# Patient Record
Sex: Female | Born: 1975 | Race: White | Hispanic: No | Marital: Married | Smoking: Former smoker
Health system: Southern US, Community
[De-identification: ages and names within clinical notes are randomized; demographics above are authoritative.]

## PROBLEM LIST (undated history)

## (undated) DIAGNOSIS — E079 Disorder of thyroid, unspecified: Secondary | ICD-10-CM

## (undated) DIAGNOSIS — I341 Nonrheumatic mitral (valve) prolapse: Secondary | ICD-10-CM

## (undated) DIAGNOSIS — Z8659 Personal history of other mental and behavioral disorders: Secondary | ICD-10-CM

## (undated) DIAGNOSIS — E8881 Metabolic syndrome: Secondary | ICD-10-CM

## (undated) DIAGNOSIS — N6019 Diffuse cystic mastopathy of unspecified breast: Secondary | ICD-10-CM

## (undated) DIAGNOSIS — G4700 Insomnia, unspecified: Secondary | ICD-10-CM

## (undated) DIAGNOSIS — N946 Dysmenorrhea, unspecified: Secondary | ICD-10-CM

## (undated) DIAGNOSIS — N920 Excessive and frequent menstruation with regular cycle: Secondary | ICD-10-CM

## (undated) DIAGNOSIS — K9041 Non-celiac gluten sensitivity: Secondary | ICD-10-CM

## (undated) DIAGNOSIS — L680 Hirsutism: Secondary | ICD-10-CM

## (undated) DIAGNOSIS — K529 Noninfective gastroenteritis and colitis, unspecified: Secondary | ICD-10-CM

## (undated) DIAGNOSIS — K859 Acute pancreatitis without necrosis or infection, unspecified: Secondary | ICD-10-CM

## (undated) DIAGNOSIS — E669 Obesity, unspecified: Secondary | ICD-10-CM

## (undated) DIAGNOSIS — K3184 Gastroparesis: Secondary | ICD-10-CM

## (undated) DIAGNOSIS — J189 Pneumonia, unspecified organism: Secondary | ICD-10-CM

## (undated) DIAGNOSIS — D734 Cyst of spleen: Secondary | ICD-10-CM

## (undated) DIAGNOSIS — Z8709 Personal history of other diseases of the respiratory system: Secondary | ICD-10-CM

## (undated) DIAGNOSIS — G43909 Migraine, unspecified, not intractable, without status migrainosus: Secondary | ICD-10-CM

## (undated) DIAGNOSIS — K52832 Lymphocytic colitis: Secondary | ICD-10-CM

## (undated) DIAGNOSIS — L659 Nonscarring hair loss, unspecified: Secondary | ICD-10-CM

## (undated) DIAGNOSIS — F419 Anxiety disorder, unspecified: Secondary | ICD-10-CM

## (undated) DIAGNOSIS — E282 Polycystic ovarian syndrome: Secondary | ICD-10-CM

## (undated) DIAGNOSIS — G629 Polyneuropathy, unspecified: Secondary | ICD-10-CM

## (undated) DIAGNOSIS — E559 Vitamin D deficiency, unspecified: Secondary | ICD-10-CM

## (undated) DIAGNOSIS — D649 Anemia, unspecified: Secondary | ICD-10-CM

## (undated) HISTORY — DX: Disorder of thyroid, unspecified: E07.9

## (undated) HISTORY — DX: Cyst of spleen: D73.4

## (undated) HISTORY — PX: OTHER SURGICAL HISTORY: SHX169

## (undated) HISTORY — PX: EXPLORATORY LAPAROTOMY: SUR591

## (undated) HISTORY — DX: Anemia, unspecified: D64.9

## (undated) HISTORY — DX: Excessive and frequent menstruation with regular cycle: N92.0

## (undated) HISTORY — DX: Insomnia, unspecified: G47.00

## (undated) HISTORY — DX: Obesity, unspecified: E66.9

## (undated) HISTORY — DX: Migraine, unspecified, not intractable, without status migrainosus: G43.909

## (undated) HISTORY — DX: Personal history of other mental and behavioral disorders: Z86.59

## (undated) HISTORY — PX: ESOPHAGOGASTRODUODENOSCOPY: SHX1529

## (undated) HISTORY — PX: CHOLECYSTECTOMY: SHX55

## (undated) HISTORY — DX: Metabolic syndrome: E88.81

## (undated) HISTORY — DX: Metabolic syndrome: E88.810

## (undated) HISTORY — DX: Polycystic ovarian syndrome: E28.2

## (undated) HISTORY — DX: Anxiety disorder, unspecified: F41.9

## (undated) HISTORY — DX: Nonscarring hair loss, unspecified: L65.9

## (undated) HISTORY — DX: Hirsutism: L68.0

## (undated) HISTORY — DX: Non-celiac gluten sensitivity: K90.41

## (undated) HISTORY — DX: Dysmenorrhea, unspecified: N94.6

## (undated) HISTORY — DX: Lymphocytic colitis: K52.832

## (undated) HISTORY — DX: Vitamin D deficiency, unspecified: E55.9

---

## 1998-10-29 ENCOUNTER — Emergency Department (HOSPITAL_COMMUNITY): Admission: EM | Admit: 1998-10-29 | Discharge: 1998-10-29 | Payer: Self-pay | Admitting: Emergency Medicine

## 1998-10-30 ENCOUNTER — Encounter: Payer: Self-pay | Admitting: Emergency Medicine

## 1998-10-30 ENCOUNTER — Ambulatory Visit (HOSPITAL_COMMUNITY): Admission: RE | Admit: 1998-10-30 | Discharge: 1998-10-30 | Payer: Self-pay | Admitting: Emergency Medicine

## 1998-10-31 ENCOUNTER — Emergency Department (HOSPITAL_COMMUNITY): Admission: EM | Admit: 1998-10-31 | Discharge: 1998-10-31 | Payer: Self-pay | Admitting: Emergency Medicine

## 1999-01-17 ENCOUNTER — Other Ambulatory Visit: Admission: RE | Admit: 1999-01-17 | Discharge: 1999-01-17 | Payer: Self-pay | Admitting: Obstetrics and Gynecology

## 1999-06-01 ENCOUNTER — Ambulatory Visit (HOSPITAL_COMMUNITY): Admission: RE | Admit: 1999-06-01 | Discharge: 1999-06-01 | Payer: Self-pay | Admitting: Obstetrics and Gynecology

## 1999-06-01 ENCOUNTER — Encounter: Payer: Self-pay | Admitting: Obstetrics and Gynecology

## 1999-06-22 ENCOUNTER — Ambulatory Visit (HOSPITAL_COMMUNITY): Admission: RE | Admit: 1999-06-22 | Discharge: 1999-06-22 | Payer: Self-pay | Admitting: Obstetrics and Gynecology

## 1999-10-17 ENCOUNTER — Emergency Department (HOSPITAL_COMMUNITY): Admission: EM | Admit: 1999-10-17 | Discharge: 1999-10-17 | Payer: Self-pay | Admitting: Emergency Medicine

## 1999-12-11 HISTORY — PX: WRIST SURGERY: SHX841

## 2000-01-03 ENCOUNTER — Other Ambulatory Visit: Admission: RE | Admit: 2000-01-03 | Discharge: 2000-01-03 | Payer: Self-pay | Admitting: Obstetrics and Gynecology

## 2000-12-04 ENCOUNTER — Emergency Department (HOSPITAL_COMMUNITY): Admission: EM | Admit: 2000-12-04 | Discharge: 2000-12-04 | Payer: Self-pay | Admitting: Emergency Medicine

## 2001-08-21 ENCOUNTER — Encounter: Payer: Self-pay | Admitting: Orthopaedic Surgery

## 2001-08-21 ENCOUNTER — Ambulatory Visit (HOSPITAL_COMMUNITY): Admission: RE | Admit: 2001-08-21 | Discharge: 2001-08-21 | Payer: Self-pay | Admitting: Orthopaedic Surgery

## 2001-09-23 ENCOUNTER — Ambulatory Visit (HOSPITAL_BASED_OUTPATIENT_CLINIC_OR_DEPARTMENT_OTHER): Admission: RE | Admit: 2001-09-23 | Discharge: 2001-09-23 | Payer: Self-pay | Admitting: Orthopaedic Surgery

## 2002-01-14 ENCOUNTER — Other Ambulatory Visit: Admission: RE | Admit: 2002-01-14 | Discharge: 2002-01-14 | Payer: Self-pay | Admitting: Obstetrics and Gynecology

## 2002-08-11 ENCOUNTER — Inpatient Hospital Stay (HOSPITAL_COMMUNITY): Admission: AD | Admit: 2002-08-11 | Discharge: 2002-08-11 | Payer: Self-pay | Admitting: Obstetrics and Gynecology

## 2002-08-25 ENCOUNTER — Inpatient Hospital Stay (HOSPITAL_COMMUNITY): Admission: AD | Admit: 2002-08-25 | Discharge: 2002-08-25 | Payer: Self-pay | Admitting: Obstetrics and Gynecology

## 2002-10-16 ENCOUNTER — Inpatient Hospital Stay (HOSPITAL_COMMUNITY): Admission: AD | Admit: 2002-10-16 | Discharge: 2002-10-16 | Payer: Self-pay | Admitting: Obstetrics and Gynecology

## 2002-10-19 ENCOUNTER — Observation Stay (HOSPITAL_COMMUNITY): Admission: AD | Admit: 2002-10-19 | Discharge: 2002-10-20 | Payer: Self-pay | Admitting: Obstetrics and Gynecology

## 2002-11-05 ENCOUNTER — Inpatient Hospital Stay (HOSPITAL_COMMUNITY): Admission: AD | Admit: 2002-11-05 | Discharge: 2002-11-09 | Payer: Self-pay | Admitting: Obstetrics and Gynecology

## 2002-11-06 ENCOUNTER — Encounter: Payer: Self-pay | Admitting: Obstetrics and Gynecology

## 2002-12-16 ENCOUNTER — Other Ambulatory Visit: Admission: RE | Admit: 2002-12-16 | Discharge: 2002-12-16 | Payer: Self-pay | Admitting: Obstetrics and Gynecology

## 2004-02-14 ENCOUNTER — Other Ambulatory Visit: Admission: RE | Admit: 2004-02-14 | Discharge: 2004-02-14 | Payer: Self-pay | Admitting: Obstetrics and Gynecology

## 2005-10-03 ENCOUNTER — Ambulatory Visit: Payer: Self-pay | Admitting: Family Medicine

## 2005-11-07 ENCOUNTER — Ambulatory Visit: Payer: Self-pay | Admitting: Family Medicine

## 2005-12-05 ENCOUNTER — Ambulatory Visit: Payer: Self-pay | Admitting: Family Medicine

## 2006-05-28 ENCOUNTER — Ambulatory Visit: Payer: Self-pay | Admitting: Family Medicine

## 2006-09-17 DIAGNOSIS — E282 Polycystic ovarian syndrome: Secondary | ICD-10-CM

## 2006-09-17 DIAGNOSIS — G43909 Migraine, unspecified, not intractable, without status migrainosus: Secondary | ICD-10-CM | POA: Insufficient documentation

## 2006-09-17 DIAGNOSIS — F339 Major depressive disorder, recurrent, unspecified: Secondary | ICD-10-CM | POA: Insufficient documentation

## 2007-01-21 ENCOUNTER — Encounter: Payer: Self-pay | Admitting: Family Medicine

## 2011-10-03 ENCOUNTER — Other Ambulatory Visit: Payer: Self-pay | Admitting: Gastroenterology

## 2011-10-03 DIAGNOSIS — R1032 Left lower quadrant pain: Secondary | ICD-10-CM

## 2011-10-05 ENCOUNTER — Other Ambulatory Visit: Payer: Self-pay

## 2011-10-18 ENCOUNTER — Other Ambulatory Visit (HOSPITAL_COMMUNITY): Payer: Self-pay | Admitting: Gastroenterology

## 2011-10-18 DIAGNOSIS — R112 Nausea with vomiting, unspecified: Secondary | ICD-10-CM

## 2011-10-24 ENCOUNTER — Ambulatory Visit (HOSPITAL_COMMUNITY)
Admission: RE | Admit: 2011-10-24 | Discharge: 2011-10-24 | Disposition: A | Discharge status: Home / Self Care | Payer: 59 | Source: Ambulatory Visit | Attending: Gastroenterology | Admitting: Gastroenterology

## 2011-10-24 DIAGNOSIS — R1032 Left lower quadrant pain: Secondary | ICD-10-CM | POA: Insufficient documentation

## 2011-10-26 ENCOUNTER — Other Ambulatory Visit: Payer: Self-pay | Admitting: Family Medicine

## 2011-10-30 ENCOUNTER — Other Ambulatory Visit: Payer: Self-pay | Admitting: Gastroenterology

## 2011-10-30 DIAGNOSIS — R1032 Left lower quadrant pain: Secondary | ICD-10-CM

## 2011-10-30 DIAGNOSIS — R112 Nausea with vomiting, unspecified: Secondary | ICD-10-CM

## 2011-10-31 ENCOUNTER — Other Ambulatory Visit (HOSPITAL_COMMUNITY): Payer: Self-pay

## 2011-11-05 ENCOUNTER — Inpatient Hospital Stay: Admission: RE | Admit: 2011-11-05 | Payer: 59 | Source: Ambulatory Visit

## 2011-11-05 ENCOUNTER — Ambulatory Visit
Admission: RE | Admit: 2011-11-05 | Discharge: 2011-11-05 | Disposition: A | Discharge status: Home / Self Care | Payer: 59 | Source: Ambulatory Visit | Attending: Gastroenterology | Admitting: Gastroenterology

## 2011-11-05 DIAGNOSIS — R112 Nausea with vomiting, unspecified: Secondary | ICD-10-CM

## 2011-11-05 DIAGNOSIS — R1032 Left lower quadrant pain: Secondary | ICD-10-CM

## 2011-11-05 MED ORDER — IOHEXOL 300 MG/ML  SOLN
125.0000 mL | Freq: Once | INTRAMUSCULAR | Status: AC | PRN
  Administered 2011-11-05: 125 mL via INTRAVENOUS

## 2012-10-12 IMAGING — CT CT ABD-PELV W/ CM
2 of 4 series · 15 of 42 positions shown, 19 images · IV contrast (READICAT/WATER & OMNI 300/[ID])
Comparison: None

CLINICAL DATA: Left lower quadrant pain, nausea, vomiting,
constipation and diarrhea, diagnosed with colitis

CT ABDOMEN AND PELVIS WITH CONTRAST
TECHNIQUE: Multidetector CT imaging of the abdomen and pelvis was
performed following the standard protocol during bolus
administration of intravenous contrast. Sagittal and coronal MPR
images reconstructed from axial data set.
Contrast: 125mL OMNIPAQUE IOHEXOL 300 MG/ML IV SOLN; Dilute oral
contrast.

[Series 2: abd/pelvis with · axial · 0.84mm/px · z∈[-374,+20]mm · 12 of 91 slices shown, 16 images]
[im 8/91  soft-tissue]
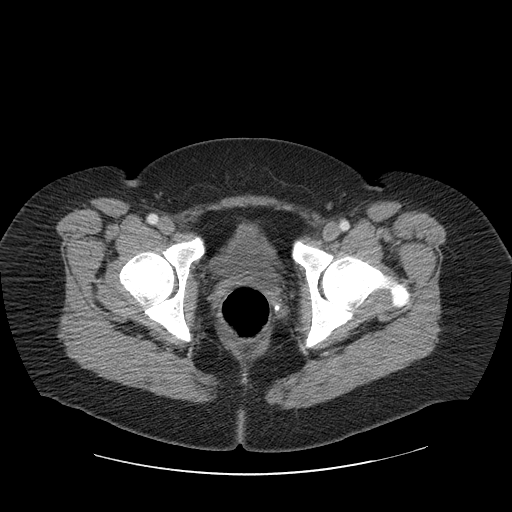
[im 8/91  bone]
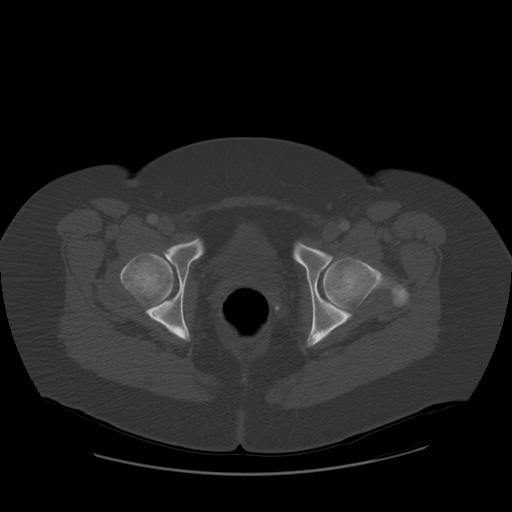
[im 16/91  soft-tissue]
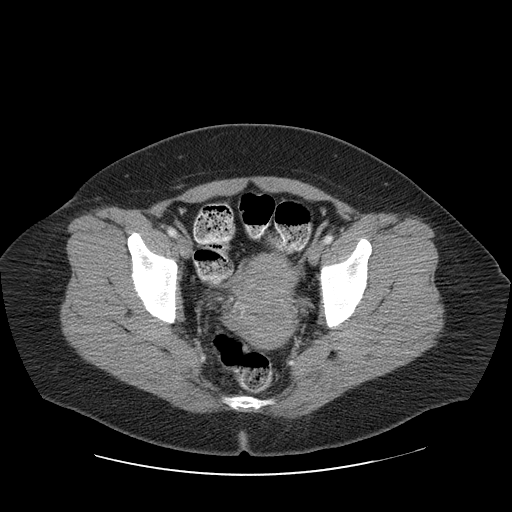
[im 23/91  soft-tissue]
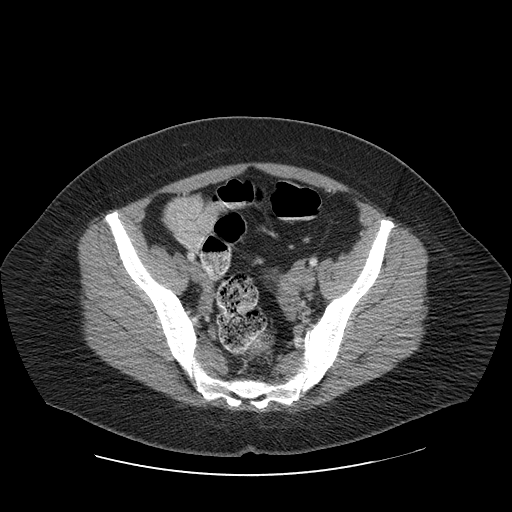
[im 34/91  soft-tissue]
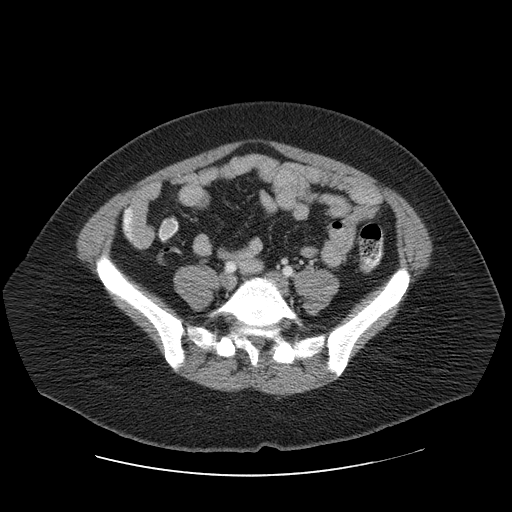
[im 42/91  soft-tissue]
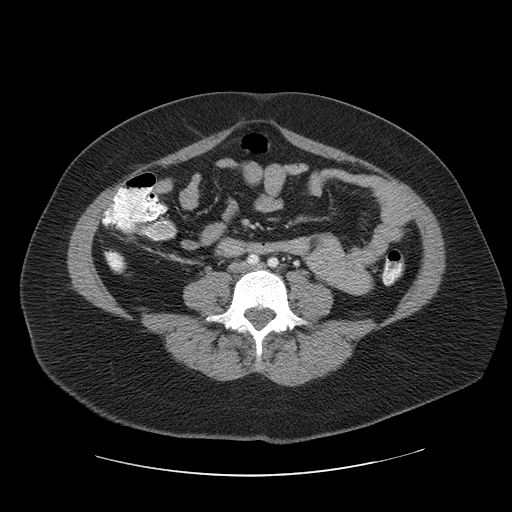
[im 49/91  soft-tissue]
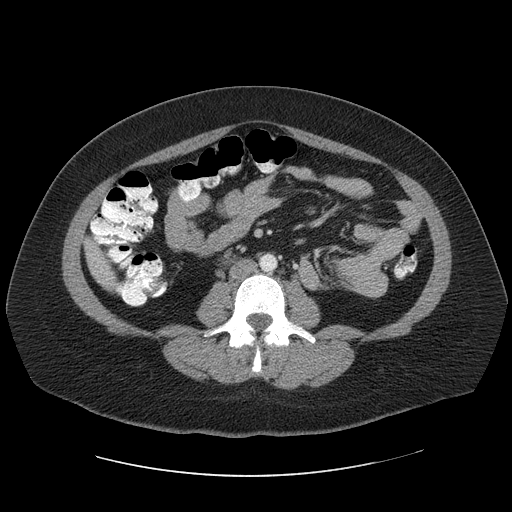
[im 57/91  soft-tissue]
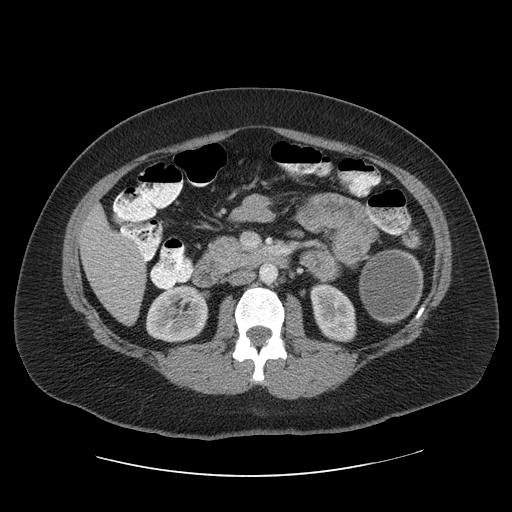
[im 68/91  soft-tissue]
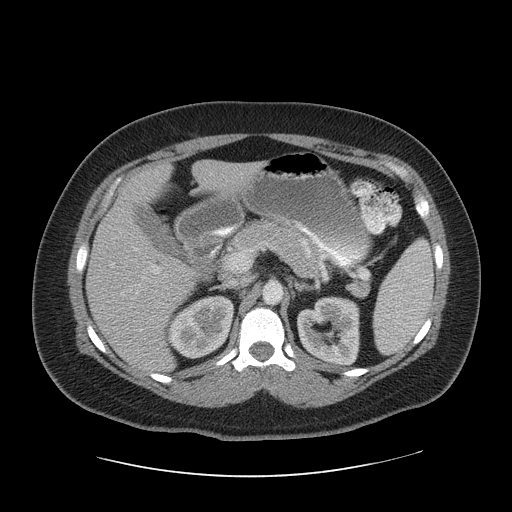
[im 76/91  soft-tissue]
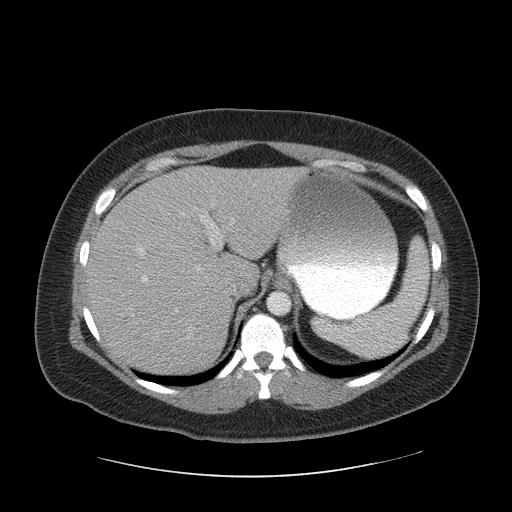
[im 76/91  lung]
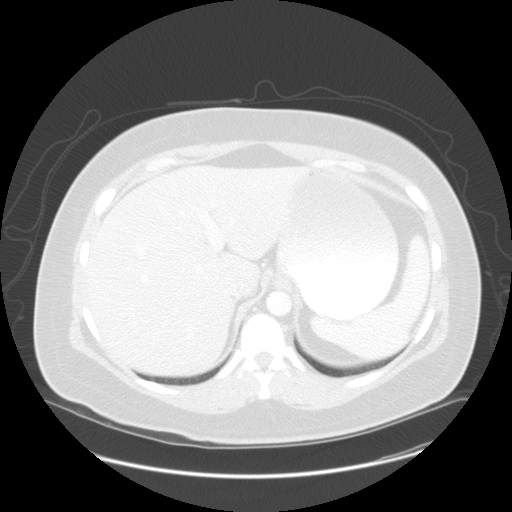
[im 76/91  bone]
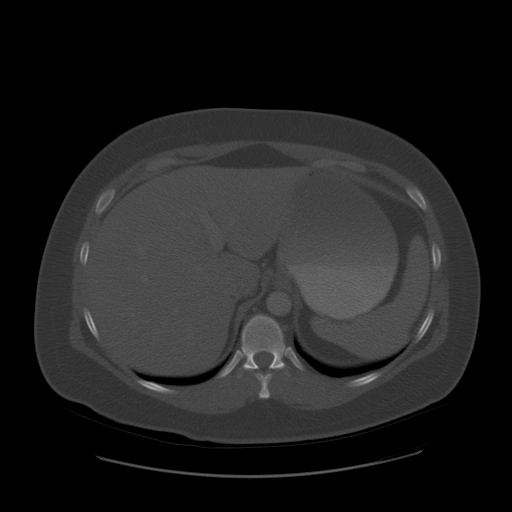
[im 79/91  lung]
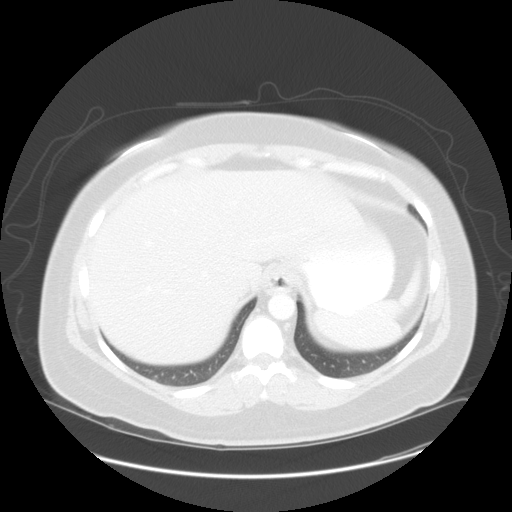
[im 83/91  soft-tissue]
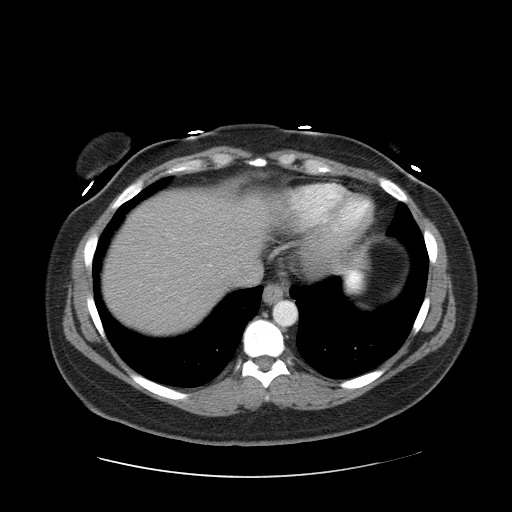
[im 83/91  lung]
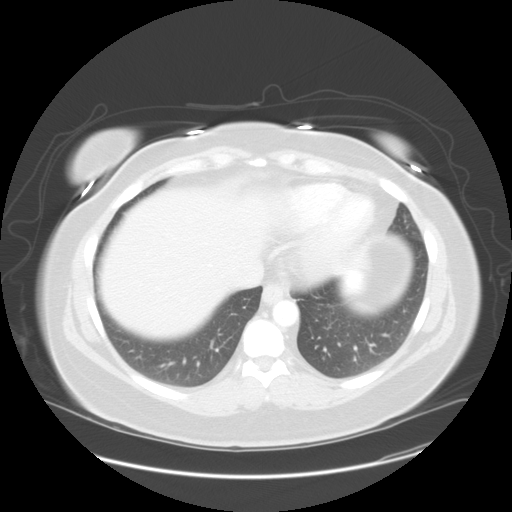
[im 87/91  lung]
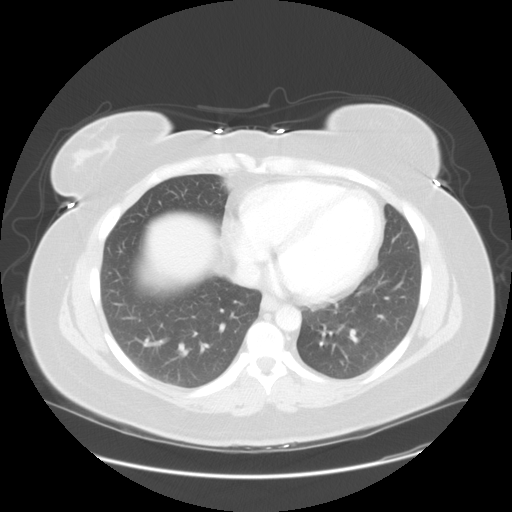

[Series 400: sag · sagittal · 0.97mm/px · 3 of 173 slices shown]
[im 35/173  soft-tissue]
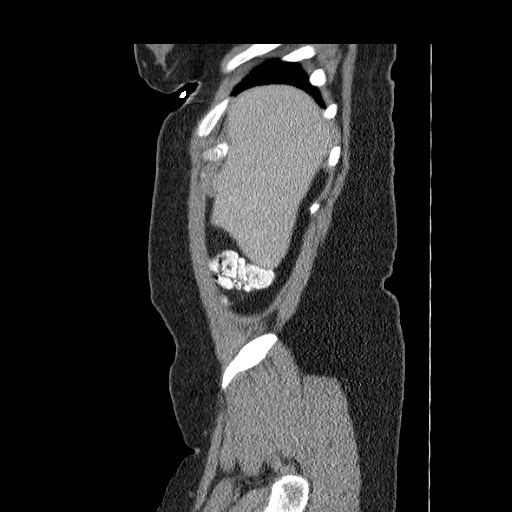
[im 69/173  soft-tissue]
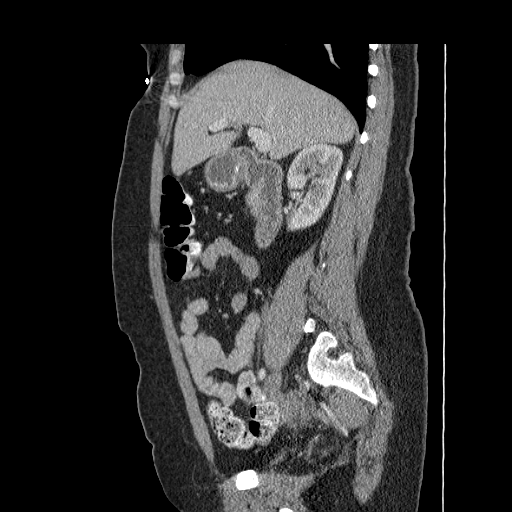
[im 104/173  soft-tissue]
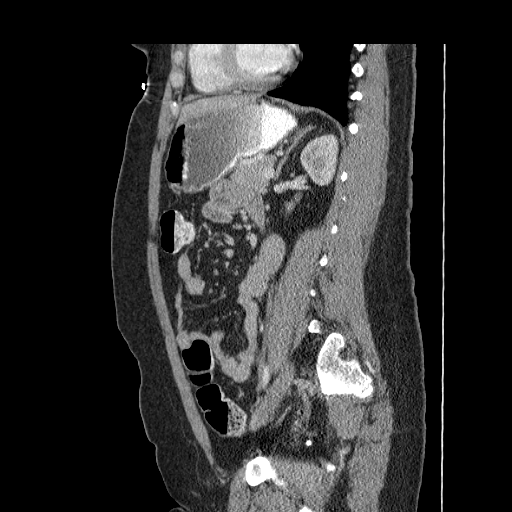

[15 of 42 positions shown; findings below may reference images not displayed]

FINDINGS: Lung bases clear.
Large complicated cyst with minimal wall calcification identified
within spleen, 6.0 x 5.3 x 6.5 cm.
No definite mural nodularity or enhancement are identified.
Liver, spleen, pancreas, kidneys, and adrenal glands otherwise
normal appearance.
Normal appendix.
Unremarkable uterus, adnexae and decompressed bladder.
Scattered pelvic phleboliths.
Stomach and bowel loops normal appearance.
No mass, adenopathy, free fluid or inflammatory process.
Specifically, no evidence of colonic wall thickening, colitis, or
diverticulosis identified.
No hernias or acute bony lesions.
IMPRESSION: Large mildly complicated cyst with minimal wall calcification
identified within spleen, 6.0 x 5.3 x 6.5 cm in size.
This is of uncertain etiology and duration.
This could be result of previous trauma/hematoma or infection.
Recommend follow-up limited ultrasound of the abdomen/spleen in 6
months to confirm stability.
Otherwise negative exam.

## 2013-07-16 ENCOUNTER — Other Ambulatory Visit: Payer: Self-pay | Admitting: Family Medicine

## 2013-07-16 DIAGNOSIS — D7389 Other diseases of spleen: Secondary | ICD-10-CM

## 2013-07-17 ENCOUNTER — Other Ambulatory Visit: Payer: 59

## 2013-08-24 ENCOUNTER — Ambulatory Visit (INDEPENDENT_AMBULATORY_CARE_PROVIDER_SITE_OTHER): Payer: 59

## 2013-08-24 DIAGNOSIS — D7389 Other diseases of spleen: Secondary | ICD-10-CM

## 2013-09-07 ENCOUNTER — Ambulatory Visit (INDEPENDENT_AMBULATORY_CARE_PROVIDER_SITE_OTHER): Payer: 59 | Admitting: General Surgery

## 2013-09-07 ENCOUNTER — Encounter (INDEPENDENT_AMBULATORY_CARE_PROVIDER_SITE_OTHER): Payer: Self-pay

## 2013-09-07 ENCOUNTER — Encounter (INDEPENDENT_AMBULATORY_CARE_PROVIDER_SITE_OTHER): Payer: Self-pay | Admitting: General Surgery

## 2013-09-07 VITALS — BP 140/90 | HR 68 | Temp 97.8°F | Resp 14 | Ht 68.0 in | Wt 243.0 lb

## 2013-09-07 DIAGNOSIS — D734 Cyst of spleen: Secondary | ICD-10-CM | POA: Insufficient documentation

## 2013-09-07 DIAGNOSIS — D7389 Other diseases of spleen: Secondary | ICD-10-CM

## 2013-09-07 HISTORY — DX: Cyst of spleen: D73.4

## 2013-09-07 NOTE — Patient Instructions (Addendum)
We will plan laparoscopic cyst unroofing.    Main risks of surgery are bleeding, infection, damage to adjacent structures, risk of open operation, risk of splenectomy, and other general surgical risks.    Please go ahead and get vaccines.  You will have lifting and activity restrictions for at least 2 weeks.    No shower for 48 hours after surgery.

## 2013-09-07 NOTE — Progress Notes (Signed)
Chief Complaint  Patient presents with  . New Evaluation    eval splenic cyst    HISTORY: The patient is a 37 year old female who presents at the request of Dr. Joycelyn Rua for consultation regarding a splenic cyst.  She has had left-sided abdominal pain for several years. Also at that time 2 years ago, she was experiencing significant bloating and bowel irregularity. She worked with the gastroenterologist and ended up switching to a gluten-free diet. She does not have celiac sprue but is felt to have a significant including hypersensitivity.  The bloating and bowel irregularity has been significantly improved and much of her pain has been resolved with switching to gluten-free diet.  However, the left-sided abdominal pain remains present. Sometimes it is more severe than others, but she is always aware of it. She underwent repeat imaging in the form of ultrasound this summer. This was compared to CT scan in 2012.  The cyst is slightly larger. The cyst is located at the lower pole and has a small amount of calcifications at the edge.  Past Medical History  Diagnosis Date  . Polycystic ovary syndrome   . Thyroid disease     chronic thyroid hormone supplementation  . H/O eating disorder   . Gluten intolerance   . Vitamin D deficiency   . Hirsutism   . Alopecia   . Migraines   . Lymphocytic colitis   . Anxiety   . Obesity   . Insomnia   . Cyst of spleen   . Dysmenorrhea   . Menorrhagia   . Insulin resistance syndrome   . Anemia     Past Surgical History  Procedure Laterality Date  . Cesarean section  2003  . Wrist surgery  1999  . Exploratory laparotomy      1999    Current Outpatient Prescriptions  Medication Sig Dispense Refill  . FLUoxetine (PROZAC) 20 MG capsule Take 20 mg by mouth every morning.      . metFORMIN (GLUCOPHAGE) 500 MG tablet Take 500 mg by mouth 2 (two) times daily with a meal.      . zolpidem (AMBIEN) 10 MG tablet Take 10 mg by mouth at bedtime as  needed for sleep.       No current facility-administered medications for this visit.     Allergies  Allergen Reactions  . Topamax [Topiramate] Anaphylaxis    neuropathy  . Ceftin [Cefuroxime Axetil] Swelling    In tongue  . Sumatriptan     REACTION: LOC,head burning     Family History  Problem Relation Age of Onset  . Cancer Maternal Grandmother     breast     History   Social History  . Marital Status: Married    Spouse Name: N/A    Number of Children: N/A  . Years of Education: N/A   Social History Main Topics  . Smoking status: Former Smoker    Quit date: 12/10/1996  . Smokeless tobacco: Never Used  . Alcohol Use: No  . Drug Use: No  . Sexual Activity: None   Social History Narrative  . 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       REVIEW OF SYSTEMS - PERTINENT POSITIVES ONLY: 12 point review of systems negative other than HPI and PMH except for abdominal distention and pain.  Headaches.    EXAM: Filed Vitals:   09/07/13 1029  BP: 140/90  Pulse: 68  Temp: 97.8 F (36.6 C)  Resp: 14   Filed Weights  09/07/13 1029  Weight: 243 lb (110.224 kg)     Gen:  No acute distress.  Well nourished and well groomed.   Neurological: Alert and oriented to person, place, and time. Coordination normal.  Head: Normocephalic and atraumatic.  Eyes: Conjunctivae are normal. Pupils are equal, round, and reactive to light. No scleral icterus.  Neck: Normal range of motion. Neck supple. No tracheal deviation or thyromegaly present.  Cardiovascular: Normal rate, regular rhythm, normal heart sounds and intact distal pulses.  Exam reveals no gallop and no friction rub.  No murmur heard. Respiratory: Effort normal.  No respiratory distress. No chest wall tenderness. Breath sounds normal.  No wheezes, rales or rhonchi.  GI: Soft. Bowel sounds are normal. The abdomen is soft and nondistended.  There is tenderness in the left abdomen just below the costal margin.  There is no  rebound and no guarding.  Musculoskeletal: Normal range of motion. Extremities are nontender.  Lymphadenopathy: No cervical, preauricular, postauricular or axillary adenopathy is present Skin: Skin is warm and dry. No rash noted. No diaphoresis. No erythema. No pallor. No clubbing, cyanosis, or edema.   Psychiatric: Normal mood and affect. Behavior is normal. Judgment and thought content normal.    LABORATORY RESULTS: Available labs are reviewed  n/a  RADIOLOGY RESULTS: See E-Chart or I-Site for most recent results.  Images and reports are reviewed. CT 2012 IMPRESSION:  Large mildly complicated cyst with minimal wall calcification  identified within spleen, 6.0 x 5.3 x 6.5 cm in size.  This is of uncertain etiology and duration.  This could be result of previous trauma/hematoma or infection.  Recommend follow-up limited ultrasound of the abdomen/spleen in 6  months to confirm stability.  Otherwise negative exam.  U/s 2014 FINDINGS:  Spleen: 8.2 cm in length. A simple cyst is 7.0 x 5.5 x 7.2 cm.  Scattered wall calcifications are again noted. A small amount of  layering debris is identified within the cyst.    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  I think her left sided abdominal pain is attributable to this.  Because the cyst is located at the lower pole, we will plan laparoscopic splenic cyst fenestration. I advised the patient that if there is significant bleeding, we may have to do a splenectomy. Because of this, I have asked her to receive the splenectomy vaccines before surgery. We discussed postoperative activity restrictions. I discussed the risk of surgery including bleeding, infection, damage to adjacent structures, risk of transfusion, risk of open operation, and risk of splenectomy.  She will work with her daughter's school schedule.       Maudry Diego MD Surgical Oncology, General and Endocrine  Surgery [facility]      Visit Diagnoses: 1. Splenic cyst     Primary Care Physician: Joycelyn Rua, MD

## 2013-09-07 NOTE — Assessment & Plan Note (Signed)
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  I think her left sided abdominal pain is attributable to this.  Because the cyst is located at the lower pole, we will plan laparoscopic splenic cyst fenestration. I advised the patient that if there is significant bleeding, we may have to do a splenectomy. Because of this, I have asked her to receive the splenectomy vaccines before surgery. We discussed postoperative activity restrictions. I discussed the risk of surgery including bleeding, infection, damage to adjacent structures, risk of transfusion, risk of open operation, and risk of splenectomy.  She will work with her daughter's school schedule.

## 2013-09-25 ENCOUNTER — Telehealth (INDEPENDENT_AMBULATORY_CARE_PROVIDER_SITE_OTHER): Payer: Self-pay

## 2013-09-25 NOTE — Telephone Encounter (Signed)
error 

## 2013-10-09 ENCOUNTER — Encounter (HOSPITAL_COMMUNITY): Payer: Self-pay | Admitting: Pharmacy Technician

## 2013-10-14 ENCOUNTER — Encounter (HOSPITAL_COMMUNITY)
Admission: RE | Admit: 2013-10-14 | Discharge: 2013-10-14 | Disposition: A | Discharge status: Home / Self Care | Payer: 59 | Source: Ambulatory Visit | Attending: General Surgery | Admitting: General Surgery

## 2013-10-14 ENCOUNTER — Encounter (HOSPITAL_COMMUNITY): Payer: Self-pay

## 2013-10-14 DIAGNOSIS — Z01810 Encounter for preprocedural cardiovascular examination: Secondary | ICD-10-CM | POA: Insufficient documentation

## 2013-10-14 DIAGNOSIS — Z01818 Encounter for other preprocedural examination: Secondary | ICD-10-CM | POA: Insufficient documentation

## 2013-10-14 DIAGNOSIS — Z01812 Encounter for preprocedural laboratory examination: Secondary | ICD-10-CM | POA: Insufficient documentation

## 2013-10-14 HISTORY — DX: Personal history of other diseases of the respiratory system: Z87.09

## 2013-10-14 HISTORY — DX: Pneumonia, unspecified organism: J18.9

## 2013-10-14 HISTORY — DX: Nonrheumatic mitral (valve) prolapse: I34.1

## 2013-10-14 HISTORY — DX: Noninfective gastroenteritis and colitis, unspecified: K52.9

## 2013-10-14 HISTORY — DX: Polyneuropathy, unspecified: G62.9

## 2013-10-14 LAB — PROTIME-INR
INR: 0.9 (ref 0.00–1.49)
Prothrombin Time: 12 seconds (ref 11.6–15.2)

## 2013-10-14 LAB — HCG, SERUM, QUALITATIVE: Preg, Serum: NEGATIVE

## 2013-10-14 LAB — ABO/RH: ABO/RH(D): A NEG

## 2013-10-14 LAB — URINALYSIS, ROUTINE W REFLEX MICROSCOPIC
Bilirubin Urine: NEGATIVE
Ketones, ur: NEGATIVE mg/dL
Leukocytes, UA: NEGATIVE
Nitrite: NEGATIVE
Protein, ur: NEGATIVE mg/dL
Urobilinogen, UA: 0.2 mg/dL (ref 0.0–1.0)
pH: 5.5 (ref 5.0–8.0)

## 2013-10-14 LAB — CBC WITH DIFFERENTIAL/PLATELET
Lymphocytes Relative: 20 % (ref 12–46)
Lymphs Abs: 1.9 10*3/uL (ref 0.7–4.0)
Neutro Abs: 6.7 10*3/uL (ref 1.7–7.7)
Neutrophils Relative %: 69 % (ref 43–77)
Platelets: 337 10*3/uL (ref 150–400)
RBC: 4.89 MIL/uL (ref 3.87–5.11)
WBC: 9.6 10*3/uL (ref 4.0–10.5)

## 2013-10-14 LAB — TYPE AND SCREEN
ABO/RH(D): A NEG
Antibody Screen: NEGATIVE

## 2013-10-14 LAB — APTT: aPTT: 27 seconds (ref 24–37)

## 2013-10-14 LAB — URINE MICROSCOPIC-ADD ON

## 2013-10-14 MED ORDER — CHLORHEXIDINE GLUCONATE 4 % EX LIQD: 1.0000 "application " | Freq: Once | CUTANEOUS | Status: DC

## 2013-10-14 NOTE — Progress Notes (Signed)
Pt doesn't have a cardiologist  Denies ever having an echo/stress test/heart cath  Joycelyn Rua is Medical Md  Denies EKG or CXR in past yr

## 2013-10-14 NOTE — Pre-Procedure Instructions (Signed)
Beth Thompson  10/14/2013   Your procedure is scheduled on:  Thurs, Nov 13 @ 7:35 AM  Report to [facility] Stay [facility]trance A at 5:30 AM.  Call this number if you have problems the morning of surgery: [phone]   Remember:   Do not eat food or drink liquids after midnight.   Take these medicines the morning of surgery with A SIP OF WATER: Fluoxetine(Prozac)               No Goody's,BC's,Aleve,Aspirin,Ibuprofen,Fish Oil,or any Herbal Medications   Do not wear jewelry, make-up or nail polish.  Do not wear lotions, powders, or perfumes. You may wear deodorant.  Do not shave 48 hours prior to surgery.  Do not bring valuables to the hospital.  [facility] is not responsible                  for any belongings or valuables.               Contacts, dentures or bridgework may not be worn into surgery.  Leave suitcase in the car. After surgery it may be brought to your room.  For patients admitted to the hospital, discharge time is determined by your                treatment team.                   Special Instructions: Shower using CHG 2 nights before surgery and the night before surgery.  If you shower the day of surgery use CHG.  Use special wash - you have one bottle of CHG for all showers.  You should use approximately 1/3 of the bottle for each shower.   Please read over the following fact sheets that you were given: Pain Booklet, Coughing and Deep Breathing, Blood Transfusion Information and Surgical Site Infection Prevention

## 2013-10-14 NOTE — Progress Notes (Signed)
cmet done this morning at medical md [phone] Called to request but results not back-will need to follow up

## 2013-10-14 NOTE — Progress Notes (Signed)
Sleep study done about a yr ago and neg for sleep apnea

## 2013-10-16 NOTE — Progress Notes (Addendum)
Anesthesia Chart Review:  Patient is a 37 year old female scheduled for laparoscopic splenic cyst unroofing, possible splenectomy on 10/22/13 by Dr. Donell Beers.  History includes obesity, former smoker, PCOS, insulin resistance syndrome, gluten intolerance, alopecia, lymphocytic colitis, MVP, migraines, anxiety, anemia, neuropathy with Topamax, thyroid disorder ([facility] notes under media tab state "chronic thyroid supplementation with no documented indication"), eating disorder.  PCP is Dr. Joycelyn Rua.   EKG on 10/14/13 showed NSR.  Preoperative labs noted. Her CMET results from 10/14/13 from her PCP office were received and showed a glucose 90, BUN 12, Cr 0.77, Na 138, K 4.2, AST 17, ALT 25.  Anticipate she can proceed as planned.  [facility] Stay [facility]/Anesthesiology Phone [phone] 10/16/2013 11:03 AM

## 2013-10-21 MED ORDER — CIPROFLOXACIN IN D5W 400 MG/200ML IV SOLN
400.0000 mg | INTRAVENOUS | Status: AC
  Administered 2013-10-22: 400 mg via INTRAVENOUS
  Filled 2013-10-21: qty 200

## 2013-10-22 ENCOUNTER — Encounter (HOSPITAL_COMMUNITY)
Admission: RE | Disposition: A | Discharge status: Home / Self Care | Payer: Self-pay | Source: Ambulatory Visit | Attending: General Surgery

## 2013-10-22 ENCOUNTER — Inpatient Hospital Stay (HOSPITAL_COMMUNITY)
Admission: RE | Admit: 2013-10-22 | Discharge: 2013-10-25 | DRG: 801 | Disposition: A | Discharge status: Home / Self Care | Payer: 59 | Source: Ambulatory Visit | Attending: General Surgery | Admitting: General Surgery

## 2013-10-22 ENCOUNTER — Inpatient Hospital Stay (HOSPITAL_COMMUNITY): Payer: 59 | Admitting: Anesthesiology

## 2013-10-22 ENCOUNTER — Encounter (HOSPITAL_COMMUNITY): Payer: Self-pay | Admitting: Surgery

## 2013-10-22 ENCOUNTER — Encounter (HOSPITAL_COMMUNITY): Payer: 59 | Admitting: Vascular Surgery

## 2013-10-22 DIAGNOSIS — D7389 Other diseases of spleen: Secondary | ICD-10-CM

## 2013-10-22 DIAGNOSIS — E669 Obesity, unspecified: Secondary | ICD-10-CM | POA: Diagnosis present

## 2013-10-22 DIAGNOSIS — F411 Generalized anxiety disorder: Secondary | ICD-10-CM | POA: Diagnosis present

## 2013-10-22 DIAGNOSIS — D734 Cyst of spleen: Secondary | ICD-10-CM

## 2013-10-22 DIAGNOSIS — Z79899 Other long term (current) drug therapy: Secondary | ICD-10-CM

## 2013-10-22 DIAGNOSIS — Z87891 Personal history of nicotine dependence: Secondary | ICD-10-CM

## 2013-10-22 DIAGNOSIS — E8881 Metabolic syndrome: Secondary | ICD-10-CM | POA: Diagnosis present

## 2013-10-22 DIAGNOSIS — E282 Polycystic ovarian syndrome: Secondary | ICD-10-CM | POA: Diagnosis present

## 2013-10-22 DIAGNOSIS — Z6837 Body mass index (BMI) 37.0-37.9, adult: Secondary | ICD-10-CM

## 2013-10-22 DIAGNOSIS — G4700 Insomnia, unspecified: Secondary | ICD-10-CM | POA: Diagnosis present

## 2013-10-22 HISTORY — PX: CYST EXCISION: SHX5701

## 2013-10-22 HISTORY — PX: LAPAROSCOPIC SPLENECTOMY: SHX409

## 2013-10-22 HISTORY — DX: Cyst of spleen: D73.4

## 2013-10-22 SURGERY — SPLENECTOMY, LAPAROSCOPIC
Anesthesia: General | Site: Abdomen | Wound class: Clean

## 2013-10-22 MED ORDER — ROCURONIUM BROMIDE 100 MG/10ML IV SOLN
INTRAVENOUS | Status: DC | PRN
  Administered 2013-10-22: 20 mg via INTRAVENOUS
  Administered 2013-10-22: 50 mg via INTRAVENOUS
  Administered 2013-10-22: 10 mg via INTRAVENOUS

## 2013-10-22 MED ORDER — ACETAMINOPHEN 500 MG PO TABS
1000.0000 mg | ORAL_TABLET | Freq: Four times a day (QID) | ORAL | Status: AC
  Administered 2013-10-22: 500 mg via ORAL
  Administered 2013-10-22 – 2013-10-23 (×2): 1000 mg via ORAL
  Filled 2013-10-22 (×3): qty 2

## 2013-10-22 MED ORDER — TIZANIDINE HCL 2 MG PO TABS
2.0000 mg | ORAL_TABLET | ORAL | Status: DC | PRN
  Filled 2013-10-22: qty 1

## 2013-10-22 MED ORDER — MIDAZOLAM HCL 2 MG/2ML IJ SOLN: 1.0000 mg | INTRAMUSCULAR | Status: DC | PRN

## 2013-10-22 MED ORDER — METFORMIN HCL 500 MG PO TABS
500.0000 mg | ORAL_TABLET | Freq: Two times a day (BID) | ORAL | Status: DC
  Administered 2013-10-22 – 2013-10-25 (×6): 500 mg via ORAL
  Filled 2013-10-22 (×10): qty 1

## 2013-10-22 MED ORDER — HEMOSTATIC AGENTS (NO CHARGE) OPTIME
TOPICAL | Status: DC | PRN
  Administered 2013-10-22 (×2): 1 via TOPICAL

## 2013-10-22 MED ORDER — CIPROFLOXACIN IN D5W 400 MG/200ML IV SOLN
400.0000 mg | Freq: Two times a day (BID) | INTRAVENOUS | Status: AC
  Administered 2013-10-22: 400 mg via INTRAVENOUS
  Filled 2013-10-22: qty 200

## 2013-10-22 MED ORDER — EPHEDRINE SULFATE 50 MG/ML IJ SOLN
INTRAMUSCULAR | Status: DC | PRN
  Administered 2013-10-22: 5 mg via INTRAVENOUS
  Administered 2013-10-22: 10 mg via INTRAVENOUS

## 2013-10-22 MED ORDER — LIDOCAINE HCL (CARDIAC) 20 MG/ML IV SOLN
INTRAVENOUS | Status: DC | PRN
  Administered 2013-10-22: 100 mg via INTRAVENOUS

## 2013-10-22 MED ORDER — SODIUM CHLORIDE 0.9 % IR SOLN
Status: DC | PRN
  Administered 2013-10-22 (×2): 1000 mL

## 2013-10-22 MED ORDER — 0.9 % SODIUM CHLORIDE (POUR BTL) OPTIME
TOPICAL | Status: DC | PRN
  Administered 2013-10-22: 1000 mL

## 2013-10-22 MED ORDER — MORPHINE SULFATE 2 MG/ML IJ SOLN
1.0000 mg | INTRAMUSCULAR | Status: DC | PRN
  Administered 2013-10-22 – 2013-10-23 (×5): 2 mg via INTRAVENOUS
  Filled 2013-10-22 (×4): qty 1

## 2013-10-22 MED ORDER — OXYCODONE-ACETAMINOPHEN 5-325 MG PO TABS
1.0000 | ORAL_TABLET | ORAL | Status: DC | PRN
  Administered 2013-10-22 – 2013-10-24 (×3): 1 via ORAL
  Administered 2013-10-24 – 2013-10-25 (×3): 2 via ORAL
  Filled 2013-10-22: qty 2
  Filled 2013-10-22: qty 1
  Filled 2013-10-22 (×2): qty 2
  Filled 2013-10-22 (×2): qty 1

## 2013-10-22 MED ORDER — HYDROMORPHONE HCL PF 1 MG/ML IJ SOLN
0.2500 mg | INTRAMUSCULAR | Status: DC | PRN
  Administered 2013-10-22 (×2): 0.5 mg via INTRAVENOUS

## 2013-10-22 MED ORDER — ONDANSETRON HCL 4 MG PO TABS
4.0000 mg | ORAL_TABLET | Freq: Four times a day (QID) | ORAL | Status: DC | PRN
  Filled 2013-10-22: qty 1

## 2013-10-22 MED ORDER — BUPIVACAINE 0.25 % ON-Q PUMP DUAL CATH 300 ML
300.0000 mL | INJECTION | Status: DC
  Filled 2013-10-22: qty 300

## 2013-10-22 MED ORDER — KCL IN DEXTROSE-NACL 20-5-0.45 MEQ/L-%-% IV SOLN
INTRAVENOUS | Status: AC
  Filled 2013-10-22: qty 1000

## 2013-10-22 MED ORDER — PROPOFOL 10 MG/ML IV BOLUS
INTRAVENOUS | Status: DC | PRN
  Administered 2013-10-22: 200 mg via INTRAVENOUS

## 2013-10-22 MED ORDER — OXYCODONE HCL 5 MG PO TABS: 5.0000 mg | ORAL_TABLET | Freq: Once | ORAL | Status: DC | PRN

## 2013-10-22 MED ORDER — ONDANSETRON HCL 4 MG/2ML IJ SOLN
4.0000 mg | Freq: Four times a day (QID) | INTRAMUSCULAR | Status: DC | PRN
  Administered 2013-10-22: 4 mg via INTRAVENOUS
  Filled 2013-10-22 (×2): qty 2

## 2013-10-22 MED ORDER — ONDANSETRON HCL 4 MG/2ML IJ SOLN
INTRAMUSCULAR | Status: DC | PRN
  Administered 2013-10-22 (×2): 4 mg via INTRAVENOUS

## 2013-10-22 MED ORDER — ONDANSETRON HCL 4 MG/2ML IJ SOLN
INTRAMUSCULAR | Status: AC
  Filled 2013-10-22: qty 2

## 2013-10-22 MED ORDER — FENTANYL CITRATE 0.05 MG/ML IJ SOLN: 50.0000 ug | Freq: Once | INTRAMUSCULAR | Status: DC

## 2013-10-22 MED ORDER — LIDOCAINE HCL (PF) 1 % IJ SOLN
INTRAMUSCULAR | Status: AC
  Filled 2013-10-22: qty 30

## 2013-10-22 MED ORDER — FENTANYL CITRATE 0.05 MG/ML IJ SOLN
INTRAMUSCULAR | Status: DC | PRN
  Administered 2013-10-22: 100 ug via INTRAVENOUS
  Administered 2013-10-22 (×2): 50 ug via INTRAVENOUS
  Administered 2013-10-22: 150 ug via INTRAVENOUS
  Administered 2013-10-22: 50 ug via INTRAVENOUS

## 2013-10-22 MED ORDER — MIDAZOLAM HCL 5 MG/5ML IJ SOLN
INTRAMUSCULAR | Status: DC | PRN
  Administered 2013-10-22: 2 mg via INTRAVENOUS

## 2013-10-22 MED ORDER — KCL IN DEXTROSE-NACL 20-5-0.45 MEQ/L-%-% IV SOLN
INTRAVENOUS | Status: DC
  Administered 2013-10-22: 1000 mL via INTRAVENOUS
  Administered 2013-10-22 – 2013-10-25 (×5): via INTRAVENOUS
  Filled 2013-10-22 (×9): qty 1000

## 2013-10-22 MED ORDER — FLUOXETINE HCL 20 MG PO CAPS
40.0000 mg | ORAL_CAPSULE | Freq: Every day | ORAL | Status: DC
  Administered 2013-10-23 – 2013-10-25 (×3): 40 mg via ORAL
  Filled 2013-10-22 (×5): qty 2

## 2013-10-22 MED ORDER — GLYCOPYRROLATE 0.2 MG/ML IJ SOLN
INTRAMUSCULAR | Status: DC | PRN
  Administered 2013-10-22: .8 mg via INTRAVENOUS

## 2013-10-22 MED ORDER — BUPIVACAINE-EPINEPHRINE PF 0.25-1:200000 % IJ SOLN
INTRAMUSCULAR | Status: AC
  Filled 2013-10-22: qty 30

## 2013-10-22 MED ORDER — HYDROMORPHONE HCL PF 1 MG/ML IJ SOLN
INTRAMUSCULAR | Status: AC
  Filled 2013-10-22: qty 1

## 2013-10-22 MED ORDER — LACTATED RINGERS IV SOLN
INTRAVENOUS | Status: DC | PRN
  Administered 2013-10-22 (×2): via INTRAVENOUS

## 2013-10-22 MED ORDER — ZOLPIDEM TARTRATE 5 MG PO TABS
5.0000 mg | ORAL_TABLET | Freq: Every evening | ORAL | Status: DC | PRN
  Administered 2013-10-22 – 2013-10-24 (×3): 5 mg via ORAL
  Filled 2013-10-22 (×3): qty 1

## 2013-10-22 MED ORDER — LIDOCAINE HCL 1 % IJ SOLN
INTRAMUSCULAR | Status: DC | PRN
  Administered 2013-10-22: 09:00:00 via INTRAMUSCULAR

## 2013-10-22 MED ORDER — PROMETHAZINE HCL 25 MG/ML IJ SOLN: 6.2500 mg | INTRAMUSCULAR | Status: DC | PRN

## 2013-10-22 MED ORDER — DIPHENHYDRAMINE HCL 25 MG PO CAPS
25.0000 mg | ORAL_CAPSULE | Freq: Four times a day (QID) | ORAL | Status: DC | PRN
  Administered 2013-10-23 – 2013-10-24 (×3): 25 mg via ORAL
  Filled 2013-10-22 (×3): qty 1

## 2013-10-22 MED ORDER — NEOSTIGMINE METHYLSULFATE 1 MG/ML IJ SOLN
INTRAMUSCULAR | Status: DC | PRN
  Administered 2013-10-22: 4 mg via INTRAVENOUS

## 2013-10-22 MED ORDER — MORPHINE SULFATE 2 MG/ML IJ SOLN
INTRAMUSCULAR | Status: AC
  Filled 2013-10-22: qty 1

## 2013-10-22 MED ORDER — OXYCODONE HCL 5 MG/5ML PO SOLN: 5.0000 mg | Freq: Once | ORAL | Status: DC | PRN

## 2013-10-22 SURGICAL SUPPLY — 92 items
BAG BILE T-TUBES STRL (MISCELLANEOUS) IMPLANT
BLADE SURG ROTATE 9660 (MISCELLANEOUS) IMPLANT
CANISTER SUCTION 2500CC (MISCELLANEOUS) ×2 IMPLANT
CATH ROBINSON RED A/P 14FR (CATHETERS) IMPLANT
CATH ROBINSON RED A/P 16FR (CATHETERS) IMPLANT
CHLORAPREP W/TINT 26ML (MISCELLANEOUS) ×2 IMPLANT
CLIP LIGATING HEM O LOK PURPLE (MISCELLANEOUS) IMPLANT
CLIP LIGATING HEMO O LOK GREEN (MISCELLANEOUS) IMPLANT
CLIP LIGATING HEMOLOK MED (MISCELLANEOUS) IMPLANT
CLIP TI LARGE 6 (CLIP) IMPLANT
CLIP TI MEDIUM 24 (CLIP) IMPLANT
CLIP TI WIDE RED SMALL 24 (CLIP) IMPLANT
COVER SURGICAL LIGHT HANDLE (MISCELLANEOUS) ×2 IMPLANT
DERMABOND ADVANCED (GAUZE/BANDAGES/DRESSINGS) ×1
DERMABOND ADVANCED .7 DNX12 (GAUZE/BANDAGES/DRESSINGS) ×1 IMPLANT
DRAIN CHANNEL 19F RND (DRAIN) IMPLANT
DRAIN PENROSE 1/2X36 STERILE (WOUND CARE) IMPLANT
DRAPE UTILITY 15X26 W/TAPE STR (DRAPE) ×4 IMPLANT
DRAPE WARM FLUID 44X44 (DRAPE) ×2 IMPLANT
DRSG COVADERM 4X10 (GAUZE/BANDAGES/DRESSINGS) IMPLANT
DRSG COVADERM 4X14 (GAUZE/BANDAGES/DRESSINGS) IMPLANT
DRSG PAD ABDOMINAL 8X10 ST (GAUZE/BANDAGES/DRESSINGS) IMPLANT
ELECT BLADE 6.5 EXT (BLADE) IMPLANT
ELECT CAUTERY BLADE 6.4 (BLADE) ×2 IMPLANT
ELECT REM PT RETURN 9FT ADLT (ELECTROSURGICAL) ×2
ELECTRODE REM PT RTRN 9FT ADLT (ELECTROSURGICAL) ×1 IMPLANT
EVACUATOR SILICONE 100CC (DRAIN) IMPLANT
GLOVE BIO SURGEON STRL SZ 6 (GLOVE) ×2 IMPLANT
GLOVE BIO SURGEON STRL SZ7.5 (GLOVE) ×2 IMPLANT
GLOVE BIOGEL PI IND STRL 6.5 (GLOVE) ×1 IMPLANT
GLOVE BIOGEL PI IND STRL 7.0 (GLOVE) ×2 IMPLANT
GLOVE BIOGEL PI IND STRL 7.5 (GLOVE) ×1 IMPLANT
GLOVE BIOGEL PI INDICATOR 6.5 (GLOVE) ×1
GLOVE BIOGEL PI INDICATOR 7.0 (GLOVE) ×2
GLOVE BIOGEL PI INDICATOR 7.5 (GLOVE) ×1
GLOVE SURG SS PI 6.5 STRL IVOR (GLOVE) ×2 IMPLANT
GLOVE SURG SS PI 7.0 STRL IVOR (GLOVE) ×2 IMPLANT
GOWN PREVENTION PLUS XXLARGE (GOWN DISPOSABLE) ×4 IMPLANT
GOWN STRL NON-REIN LRG LVL3 (GOWN DISPOSABLE) ×2 IMPLANT
HEMOSTAT SNOW SURGICEL 2X4 (HEMOSTASIS) ×4 IMPLANT
KIT BASIN OR (CUSTOM PROCEDURE TRAY) ×2 IMPLANT
KIT ROOM TURNOVER OR (KITS) ×2 IMPLANT
NS IRRIG 1000ML POUR BTL (IV SOLUTION) ×2 IMPLANT
PACK GENERAL/GYN (CUSTOM PROCEDURE TRAY) ×2 IMPLANT
PAD ARMBOARD 7.5X6 YLW CONV (MISCELLANEOUS) ×4 IMPLANT
PENCIL BUTTON HOLSTER BLD 10FT (ELECTRODE) ×2 IMPLANT
PLUG CATH AND CAP STER (CATHETERS) IMPLANT
POUCH SPECIMEN RETRIEVAL 10MM (ENDOMECHANICALS) ×4 IMPLANT
RELOAD WHITE ECR60W (STAPLE) IMPLANT
SCALPEL HARMONIC ACE (MISCELLANEOUS) ×2 IMPLANT
SCISSORS HARMONIC WAVE 18CM (INSTRUMENTS) IMPLANT
SEALANT SURGICAL APPL DUAL CAN (MISCELLANEOUS) ×2 IMPLANT
SET IRRIG TUBING LAPAROSCOPIC (IRRIGATION / IRRIGATOR) ×2 IMPLANT
SLEEVE ENDOPATH XCEL 5M (ENDOMECHANICALS) ×4 IMPLANT
SLEEVE SURGEON STRL (DRAPES) IMPLANT
SPONGE GAUZE 4X4 12PLY (GAUZE/BANDAGES/DRESSINGS) IMPLANT
STAPLE ECHEON FLEX 60 POW ENDO (STAPLE) IMPLANT
STAPLER VISISTAT 35W (STAPLE) IMPLANT
SUT CHROMIC 3 0 SH 27 (SUTURE) IMPLANT
SUT CHROMIC 4 0 RB 1X27 (SUTURE) IMPLANT
SUT ETHILON 2 0 FS 18 (SUTURE) IMPLANT
SUT MNCRL AB 4-0 PS2 18 (SUTURE) ×4 IMPLANT
SUT PDS AB 1 TP1 96 (SUTURE) IMPLANT
SUT PDS AB 3-0 SH 27 (SUTURE) IMPLANT
SUT PDS AB 4-0 RB1 27 (SUTURE) ×12 IMPLANT
SUT PDS II 0 TP-1 LOOPED 60 (SUTURE) IMPLANT
SUT PROLENE 3 0 SH 48 (SUTURE) IMPLANT
SUT PROLENE 4 0 RB 1 (SUTURE)
SUT PROLENE 4 0 SH DA (SUTURE) IMPLANT
SUT PROLENE 4-0 RB1 .5 CRCL 36 (SUTURE) IMPLANT
SUT PROLENE 5 0 C1 (SUTURE) IMPLANT
SUT SILK 2 0 SH CR/8 (SUTURE) IMPLANT
SUT SILK 2 0 TIES 10X30 (SUTURE) IMPLANT
SUT SILK 3 0 SH CR/8 (SUTURE) IMPLANT
SUT SILK 3 0 TIES 10X30 (SUTURE) IMPLANT
SUT SILK 3 0SH CR/8 30 (SUTURE) IMPLANT
SUT VIC AB 3-0 SH 18 (SUTURE) IMPLANT
SUT VIC AB 5-0 TF 27 (SUTURE) IMPLANT
SUT VICRYL AB 2 0 TIES (SUTURE) IMPLANT
SYS LAPSCP GELPORT 120MM (MISCELLANEOUS)
SYSTEM LAPSCP GELPORT 120MM (MISCELLANEOUS) IMPLANT
TOWEL OR 17X24 6PK STRL BLUE (TOWEL DISPOSABLE) ×2 IMPLANT
TOWEL OR 17X26 10 PK STRL BLUE (TOWEL DISPOSABLE) ×2 IMPLANT
TOWEL OR NON WOVEN STRL DISP B (DISPOSABLE) ×4 IMPLANT
TRAY FOLEY CATH 14FRSI W/METER (CATHETERS) ×2 IMPLANT
TRAY LAPAROSCOPIC (CUSTOM PROCEDURE TRAY) ×2 IMPLANT
TROCAR XCEL 12X100 BLDLESS (ENDOMECHANICALS) ×2 IMPLANT
TROCAR XCEL BLUNT TIP 100MML (ENDOMECHANICALS) ×2 IMPLANT
TROCAR XCEL NON-BLD 5MMX100MML (ENDOMECHANICALS) ×2 IMPLANT
TUBE CONNECTING 12X1/4 (SUCTIONS) ×2 IMPLANT
TUBING FILTER THERMOFLATOR (ELECTROSURGICAL) ×2 IMPLANT
YANKAUER SUCT BULB TIP NO VENT (SUCTIONS) ×2 IMPLANT

## 2013-10-22 NOTE — Op Note (Signed)
PRE-OPERATIVE DIAGNOSIS: splenic cyst  POST-OPERATIVE DIAGNOSIS:  Same  PROCEDURE:  Procedure(s): Laparoscopic splenic cystectomy  SURGEON:  Surgeon(s): Almond Lint, MD  ASSIST:  Romie Levee, MD   ANESTHESIA:   local and general  DRAINS: none   LOCAL MEDICATIONS USED:  MARCAINE    and XYLOCAINE   SPECIMEN:  Source of Specimen:  splenic cyst  DISPOSITION OF SPECIMEN:  PATHOLOGY  COUNTS:  YES  DICTATION: .Dragon Dictation  PLAN OF CARE: Admit to inpatient   PATIENT DISPOSITION:  PACU - hemodynamically stable.  EBL:  50 mL  FINDINGS:  Calcified splenic cyst.  PROCEDURE:  Patient was identified in the holding area and taken to the operating room where she was placed supine on the operating room table. General endotracheal anesthesia was induced. A Foley catheter was placed. The patient was then placed into the leaning spleen position on a beanbag. Care was taken to place an axillary roll and adequately cushion her and secured her to the bed.  Her abdomen was then prepped and draped in sterile fashion. Timeout was performed according to the surgical safety checklist. When all was correct, we continued.  A Hassan trocar was placed in the infraumbilical position after administration of local anesthetic. A curvilinear incision was made at the site of her previous incision. A Tresa Endo was used to dissect the subcutaneous tissues. The umbilical stalk was elevated with a Coker clamp and an incision was made in the fascia vertically. A pursestring suture was placed around the fascial incision. The Roseanne Reno was introduced into the abdomen and pelvis placed to the abdominal wall with Telfa the suture. Pneumoperitoneum was achieved to a pressure of 15 mm of mercury.  25 mm trochars were placed under direct visualization in the midline. A third one was placed in the left upper quadrant. The omentum was pulled away from the spleen. The lienoolic ligament was taken down with the harmonic scalpel.  The sling this was immediately visible at the inferior border of the spleen. The harmonic scalpel was used to enter the cyst and the cystic fluid was aspirated. The cyst was grasped and a harmonic was used to take off the top portion of this.  At this point, the cyst wall began to separate easily from the spleen and so the harmonic was used to take the cyst wall from the spleen. The cyst wall was taken in multiple pieces.    The cyst wall was retrieved from the abdomen with an Endo Catch bag.  The edges of the spleen was cauterized with the spatula cautery.  SNOW hemostatic agent was placed in the splenic bed.  Omentum was placed over the top.    A 4 quadrant inspection was performed. There was no evidence of bleeding or other gross pathology.    The 5 mm trocars were removed and there was no bleeding from the abdominal wall.  Pneumoperitoneum was removed.  The pursestring suture was used to close the umbilical incision.  No residual fascial defect was palpable.  The skin was closed with a 4-0 monocryl.  The wounds were cleaned, dried, and dressed with dermabond.    The patient was awakened from anesthesia and taken to the PACU in stable condition.

## 2013-10-22 NOTE — H&P (Signed)
Chief Complaint   Patient presents with   .  New Evaluation     eval splenic cyst    HISTORY:  The patient is a 37 year old female who presents at the request of Dr. Joycelyn Rua for consultation regarding a splenic cyst. She has had left-sided abdominal pain for several years. Also at that time 2 years ago, she was experiencing significant bloating and bowel irregularity. She worked with the gastroenterologist and ended up switching to a gluten-free diet. She does not have celiac sprue but is felt to have a significant including hypersensitivity. The bloating and bowel irregularity has been significantly improved and much of her pain has been resolved with switching to gluten-free diet.  However, the left-sided abdominal pain remains present. Sometimes it is more severe than others, but she is always aware of it. She underwent repeat imaging in the form of ultrasound this summer. This was compared to CT scan in 2012. The cyst is slightly larger. The cyst is located at the lower pole and has a small amount of calcifications at the edge.   She has not had any changes since her last evaluation.   Past Medical History   Diagnosis  Date   .  Polycystic ovary syndrome    .  Thyroid disease      chronic thyroid hormone supplementation   .  H/O eating disorder    .  Gluten intolerance    .  Vitamin D deficiency    .  Hirsutism    .  Alopecia    .  Migraines    .  Lymphocytic colitis    .  Anxiety    .  Obesity    .  Insomnia    .  Cyst of spleen    .  Dysmenorrhea    .  Menorrhagia    .  Insulin resistance syndrome    .  Anemia     Past Surgical History   Procedure  Laterality  Date   .  Cesarean section   2003   .  Wrist surgery   1999   .  Exploratory laparotomy       1999    Current Outpatient Prescriptions   Medication  Sig  Dispense  Refill   .  FLUoxetine (PROZAC) 20 MG capsule  Take 20 mg by mouth every morning.     .  metFORMIN (GLUCOPHAGE) 500 MG tablet  Take 500 mg by  mouth 2 (two) times daily with a meal.     .  zolpidem (AMBIEN) 10 MG tablet  Take 10 mg by mouth at bedtime as needed for sleep.      No current facility-administered medications for this visit.    Allergies   Allergen  Reactions   .  Topamax [Topiramate]  Anaphylaxis     neuropathy   .  Ceftin [Cefuroxime Axetil]  Swelling     In tongue   .  Sumatriptan      REACTION: LOC,head burning    Family History   Problem  Relation  Age of Onset   .  Cancer  Maternal Grandmother      breast    History    Social History   .  Marital Status:  Married     Spouse Name:  N/A     Number of Children:  N/A   .  Years of Education:  N/A    Social History Main Topics   .  Smoking status:  Former Smoker     Quit date:  12/10/1996   .  Smokeless tobacco:  Never Used   .  Alcohol Use:  No   .  Drug Use:  No   .  Sexual Activity:  None    Social History Narrative   .  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    REVIEW OF SYSTEMS - PERTINENT POSITIVES ONLY:  12 point review of systems negative other than HPI and PMH except for abdominal distention and pain. Headaches.   EXAM:  Filed Vitals:    09/07/13 1029   BP:  140/90   Pulse:  68   Temp:  97.8 F (36.6 C)   Resp:  14    Filed Weights    09/07/13 1029   Weight:  243 lb (110.224 kg)    Gen: No acute distress. Well nourished and well groomed.  Neurological: Alert and oriented to person, place, and time. Coordination normal.  Head: Normocephalic and atraumatic.  Eyes: Conjunctivae are normal. Pupils are equal, round, and reactive to light. No scleral icterus.   Cardiovascular: Normal rate, regular rhythm Respiratory: Effort normal. No respiratory distress. No chest wall tenderness.   GI: Soft.  The abdomen is soft and nondistended. There is tenderness in the left abdomen just below the costal margin. There is no rebound and no guarding.  Musculoskeletal: Normal range of motion. Extremities are nontender.  Lymphadenopathy: No  cervical, preauricular, postauricular or axillary adenopathy is present Skin: Skin is warm and dry. No rash noted. No diaphoresis. No erythema. No pallor. No clubbing, cyanosis, or edema.  Psychiatric: Normal mood and affect. Behavior is normal. Judgment and thought content normal.   LABORATORY RESULTS:  Available labs are reviewed  n/a  RADIOLOGY RESULTS:  See E-Chart or I-Site for most recent results. Images and reports are reviewed.  CT 2012  IMPRESSION:  Large mildly complicated cyst with minimal wall calcification  identified within spleen, 6.0 x 5.3 x 6.5 cm in size.  This is of uncertain etiology and duration.  This could be result of previous trauma/hematoma or infection.  Recommend follow-up limited ultrasound of the abdomen/spleen in 6  months to confirm stability.  Otherwise negative exam.  U/s 2014  FINDINGS:  Spleen: 8.2 cm in length. A simple cyst is 7.0 x 5.5 x 7.2 cm.  Scattered wall calcifications are again noted. A small amount of  layering debris is identified within the cyst.   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  I think her left sided abdominal pain is attributable to this.  Because the cyst is located at the lower pole, we will plan laparoscopic splenic cyst fenestration.  I advised the patient that if there is significant bleeding, we may have to do a splenectomy. Because of this, I have asked her to receive the splenectomy vaccines before surgery.   We discussed postoperative activity restrictions. I discussed the risk of surgery including bleeding, infection, damage to adjacent structures, risk of transfusion, risk of open operation, and risk of splenectomy.    Maudry Diego MD  Surgical Oncology, General and Endocrine Surgery  [facility]   Visit Diagnoses:  1.  Splenic cyst   Primary Care Physician:  Joycelyn Rua, MD

## 2013-10-22 NOTE — Anesthesia Postprocedure Evaluation (Signed)
  Anesthesia Post-op Note  Patient: Beth Thompson  Procedure(s) Performed: Procedure(s): LAPAROSCOPIC SPLENIC CYST UNROOFING  (N/A)  Patient Location: PACU  Anesthesia Type:General  Level of Consciousness: awake  Airway and Oxygen Therapy: Patient Spontanous Breathing  Post-op Pain: mild  Post-op Assessment: Post-op Vital signs reviewed, Patient's Cardiovascular Status Stable, Respiratory Function Stable, Patent Airway, No signs of Nausea or vomiting and Pain level controlled  Post-op Vital Signs: Reviewed and stable  Complications: No apparent anesthesia complications

## 2013-10-22 NOTE — Transfer of Care (Signed)
Immediate Anesthesia Transfer of Care Note  Patient: Beth Thompson  Procedure(s) Performed: Procedure(s): LAPAROSCOPIC SPLENIC CYST UNROOFING  (N/A)  Patient Location: PACU  Anesthesia Type:General  Level of Consciousness: awake, alert  and oriented  Airway & Oxygen Therapy: Patient Spontanous Breathing and Patient connected to face mask oxygen  Post-op Assessment: Report given to PACU RN and Post -op Vital signs reviewed and stable  Post vital signs: Reviewed and stable  Complications: No apparent anesthesia complications

## 2013-10-22 NOTE — Anesthesia Preprocedure Evaluation (Signed)
Anesthesia Evaluation  Patient identified by MRN, date of birth, ID band Patient awake    Reviewed: Allergy & Precautions, H&P , NPO status , Patient's Chart, lab work & pertinent test results  Airway Mallampati: II TM Distance: >3 FB Neck ROM: Full    Dental   Pulmonary COPDformer smoker,  breath sounds clear to auscultation        Cardiovascular hypertension, + Valvular Problems/Murmurs MVP Rhythm:Regular Rate:Normal     Neuro/Psych  Headaches, Depression    GI/Hepatic   Endo/Other  diabetes  Renal/GU      Musculoskeletal   Abdominal (+) + obese,   Peds  Hematology   Anesthesia Other Findings Undx htn  Reproductive/Obstetrics                           Anesthesia Physical Anesthesia Plan  ASA: III  Anesthesia Plan: General   Post-op Pain Management:    Induction: Intravenous  Airway Management Planned: Oral ETT  Additional Equipment:   Intra-op Plan:   Post-operative Plan: Extubation in OR  Informed Consent: I have reviewed the patients History and Physical, chart, labs and discussed the procedure including the risks, benefits and alternatives for the proposed anesthesia with the patient or authorized representative who has indicated his/her understanding and acceptance.     Plan Discussed with: CRNA and Surgeon  Anesthesia Plan Comments:         Anesthesia Quick Evaluation

## 2013-10-22 NOTE — Anesthesia Procedure Notes (Signed)
Procedure Name: Intubation Date/Time: 10/22/2013 7:47 AM Performed by: Whitman Hero Pre-anesthesia Checklist: Patient identified, Emergency Drugs available, Suction available and Patient being monitored Patient Re-evaluated:Patient Re-evaluated prior to inductionOxygen Delivery Method: Circle system utilized Preoxygenation: Pre-oxygenation with 100% oxygen Intubation Type: IV induction Ventilation: Mask ventilation without difficulty Laryngoscope Size: Mac and 3 Grade View: Grade I Tube type: Oral Tube size: 7.5 mm Airway Equipment and Method: Stylet Placement Confirmation: ETT inserted through vocal cords under direct vision,  positive ETCO2 and breath sounds checked- equal and bilateral Secured at: 22 cm Tube secured with: Tape Dental Injury: Teeth and Oropharynx as per pre-operative assessment

## 2013-10-23 LAB — CBC
HCT: 37.4 % (ref 36.0–46.0)
HCT: 38.5 % (ref 36.0–46.0)
Hemoglobin: 12.2 g/dL (ref 12.0–15.0)
Hemoglobin: 12.6 g/dL (ref 12.0–15.0)
MCH: 28 pg (ref 26.0–34.0)
MCH: 27.9 pg (ref 26.0–34.0)
MCHC: 32.6 g/dL (ref 30.0–36.0)
MCHC: 32.7 g/dL (ref 30.0–36.0)
MCV: 85.4 fL (ref 78.0–100.0)
Platelets: 266 10*3/uL (ref 150–400)
Platelets: 296 10*3/uL (ref 150–400)
RBC: 4.36 MIL/uL (ref 3.87–5.11)
RBC: 4.51 MIL/uL (ref 3.87–5.11)
RDW: 14 % (ref 11.5–15.5)
WBC: 8.9 10*3/uL (ref 4.0–10.5)
WBC: 9.6 10*3/uL (ref 4.0–10.5)

## 2013-10-23 LAB — BASIC METABOLIC PANEL
BUN: 6 mg/dL (ref 6–23)
CO2: 26 mEq/L (ref 19–32)
Chloride: 104 mEq/L (ref 96–112)
Creatinine, Ser: 0.72 mg/dL (ref 0.50–1.10)
GFR calc Af Amer: >90 mL/min (ref >90)
Glucose, Bld: 127 mg/dL — ABNORMAL HIGH (ref 70–99)
Potassium: 4.3 mEq/L (ref 3.5–5.1)
Sodium: 137 mEq/L (ref 135–145)

## 2013-10-23 LAB — PHOSPHORUS: Phosphorus: 3 mg/dL (ref 2.3–4.6)

## 2013-10-23 LAB — MAGNESIUM: Magnesium: 1.8 mg/dL (ref 1.5–2.5)

## 2013-10-23 MED ORDER — DIPHENHYDRAMINE HCL 50 MG/ML IJ SOLN: 12.5000 mg | Freq: Four times a day (QID) | INTRAMUSCULAR | Status: DC | PRN

## 2013-10-23 MED ORDER — HYDROMORPHONE HCL PF 1 MG/ML IJ SOLN
2.0000 mg | Freq: Once | INTRAMUSCULAR | Status: AC
  Administered 2013-10-23: 2 mg via INTRAVENOUS

## 2013-10-23 MED ORDER — SODIUM CHLORIDE 0.9 % IJ SOLN: 9.0000 mL | INTRAMUSCULAR | Status: DC | PRN

## 2013-10-23 MED ORDER — ONDANSETRON HCL 4 MG/2ML IJ SOLN: 4.0000 mg | Freq: Four times a day (QID) | INTRAMUSCULAR | Status: DC | PRN

## 2013-10-23 MED ORDER — NALOXONE HCL 0.4 MG/ML IJ SOLN: 0.4000 mg | INTRAMUSCULAR | Status: DC | PRN

## 2013-10-23 MED ORDER — DIPHENHYDRAMINE HCL 12.5 MG/5ML PO ELIX: 12.5000 mg | ORAL_SOLUTION | Freq: Four times a day (QID) | ORAL | Status: DC | PRN

## 2013-10-23 MED ORDER — HYDROMORPHONE HCL PF 1 MG/ML IJ SOLN
INTRAMUSCULAR | Status: AC
  Administered 2013-10-23: 2 mg via INTRAVENOUS
  Filled 2013-10-23: qty 2

## 2013-10-23 MED ORDER — HYDROMORPHONE 0.3 MG/ML IV SOLN
INTRAVENOUS | Status: DC
  Administered 2013-10-23: 3.3 mg via INTRAVENOUS
  Administered 2013-10-23: 0.9 mg via INTRAVENOUS
  Administered 2013-10-23: 0.1 mg via INTRAVENOUS
  Administered 2013-10-23: 11:00:00 via INTRAVENOUS
  Administered 2013-10-24: 1.5 mg via INTRAVENOUS
  Administered 2013-10-24: 0.9 mg via INTRAVENOUS
  Administered 2013-10-24: 3 mg via INTRAVENOUS
  Administered 2013-10-24: 16:00:00 via INTRAVENOUS
  Administered 2013-10-24: 2.19 mg via INTRAVENOUS
  Administered 2013-10-24: 1.65 mg via INTRAVENOUS
  Administered 2013-10-24: 1.2 mg via INTRAVENOUS
  Administered 2013-10-25: 1.5 mg via INTRAVENOUS
  Filled 2013-10-23 (×3): qty 25

## 2013-10-23 NOTE — Progress Notes (Signed)
1 Day Post-Op  Subjective: Pt was doing well until she got up to go to bathroom.  She had severe LUQ pain that was "stabbing."    She denies nausea.  She is not having any dizziness.     Objective: Vital signs in last 24 hours: Temp:  [97 F (36.1 C)-98.5 F (36.9 C)] 98.5 F (36.9 C) (11/14 0602) Pulse Rate:  [56-86] 56 (11/14 0602) Resp:  [10-18] 18 (11/14 0602) BP: (121-149)/(61-90) 135/86 mmHg (11/14 0602) SpO2:  [97 %-100 %] 98 % (11/14 0602) Last BM Date: 10/21/13  Intake/Output from previous day: 11/13 0701 - 11/14 0700 In: 4263.3 [P.O.:470; I.V.:3793.3] Out: 3850 [Urine:3850] Intake/Output this shift:    General appearance: alert, cooperative and moderate distress Head: Normocephalic, without obvious abnormality, atraumatic GI: soft, non distended, LUQ tenderness, remainder of abdomen non tender. Skin: no pallor.  good color.    Lab Results:   Recent Labs  10/23/13 0545  WBC 8.9  HGB 12.2  HCT 37.4  PLT 266   BMET  Recent Labs  10/23/13 0545  NA 137  K 4.3  CL 104  CO2 26  GLUCOSE 127*  BUN 6  CREATININE 0.72  CALCIUM 8.7   PT/INR No results found for this basename: LABPROT, INR,  in the last 72 hours ABG No results found for this basename: PHART, PCO2, PO2, HCO3,  in the last 72 hours  Studies/Results: No results found.  Anti-infectives: Anti-infectives   Start     Dose/Rate Route Frequency Ordered Stop   10/22/13 2000  ciprofloxacin (CIPRO) IVPB 400 mg     400 mg 200 mL/hr over 60 Minutes Intravenous Every 12 hours 10/22/13 1342 10/22/13 2237   10/22/13 0600  ciprofloxacin (CIPRO) IVPB 400 mg     400 mg 200 mL/hr over 60 Minutes Intravenous On call to O.R. 10/21/13 1417 10/22/13 0758      Assessment/Plan: s/p Procedure(s): LAPAROSCOPIC SPLENIC CYST UNROOFING  (N/A) d/c foley Change to PCA Get repeat CBC later this AM Recheck vitals.  Bed rest.    LOS: 1 day    [facility] 10/23/2013

## 2013-10-23 NOTE — Progress Notes (Signed)
Utilization review completed. Danique Hartsough, RN, BSN. 

## 2013-10-24 ENCOUNTER — Encounter (HOSPITAL_COMMUNITY): Payer: Self-pay | Admitting: Surgery

## 2013-10-24 NOTE — Progress Notes (Signed)
2 Days Post-Op   Assessment: s/p Procedure(s): LAPAROSCOPIC SPLENIC CYST UNROOFING  Patient Active Problem List   Diagnosis Date Noted  . Splenic cyst 09/07/2013  . 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 DEPRESSION, MAJOR, RECURRENT 09/17/2006  . MIGRAINE, UNSPEC., W/O INTRACTABLE MIGRAINE 09/17/2006    Improved today  Plan: Plan for discharge tomorrow Increase activity today  Subjective: Feels much better this am, tolerating diet, not had a BM yet, would like to get out of bed  Objective: Vital signs in last 24 hours: Temp:  [97 F (36.1 C)-98.4 F (36.9 C)] 98.4 F (36.9 C) (11/15 0531) Pulse Rate:  [67-82] 68 (11/15 0531) Resp:  [12-19] 17 (11/15 0800) BP: (127-139)/(65-86) 128/65 mmHg (11/15 0531) SpO2:  [94 %-98 %] 98 % (11/15 0800) Weight:  [245 lb 4.8 oz (111.267 kg)] 245 lb 4.8 oz (111.267 kg) (11/14 1201)   Intake/Output from previous day: 11/14 0701 - 11/15 0700 In: -  Out: 950 [Urine:950]  General appearance: alert, cooperative and no distress Resp: clear to auscultation bilaterally Cardio: regular rate and rhythm, S1, S2 normal, no murmur, click, rub or gallop GI: Soft, not tender, dressings dry, BS+  Incision: healing well  Lab Results:   Recent Labs  10/23/13 0545 10/23/13 1050  WBC 8.9 9.6  HGB 12.2 12.6  HCT 37.4 38.5  PLT 266 296   BMET  Recent Labs  10/23/13 0545  NA 137  K 4.3  CL 104  CO2 26  GLUCOSE 127*  BUN 6  CREATININE 0.72  CALCIUM 8.7    MEDS, Scheduled . FLUoxetine  40 mg Oral Daily  . HYDROmorphone PCA 0.3 mg/mL   Intravenous Q4H  . metFORMIN  500 mg Oral BID WC    Studies/Results: No results found.    LOS: 2 days     Currie Paris, MD, [facility], [place] [phone]   10/24/2013 9:27 AM

## 2013-10-25 MED ORDER — OXYCODONE-ACETAMINOPHEN 5-325 MG PO TABS: 1.0000 | ORAL_TABLET | ORAL | Status: DC | PRN

## 2013-10-25 NOTE — Progress Notes (Signed)
3 Days Post-Op   Assessment: s/p Procedure(s): LAPAROSCOPIC SPLENIC CYST UNROOFING  Patient Active Problem List   Diagnosis Date Noted  . Splenic cyst 09/07/2013  . 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 DEPRESSION, MAJOR, RECURRENT 09/17/2006  . MIGRAINE, UNSPEC., W/O INTRACTABLE MIGRAINE 09/17/2006    Improved  Plan: Discharge If diet tolerated through lunch  Subjective: Feels better just some soreness and occasional stabbing pain in LUQ. No nausea. Has not had a BM, has passed gas. Uses pro-biotics at home to promote BMs  Objective: Vital signs in last 24 hours: Temp:  [97.7 F (36.5 C)-98.3 F (36.8 C)] 97.7 F (36.5 C) (11/16 0453) Pulse Rate:  [65-85] 71 (11/16 0453) Resp:  [13-19] 13 (11/16 0858) BP: (114-153)/(70-95) 114/70 mmHg (11/16 0453) SpO2:  [94 %-99 %] 98 % (11/16 0858)   Intake/Output from previous day: 11/15 0701 - 11/16 0700 In: 3271 [I.V.:3271] Out: 2250 [Urine:2250]  General appearance: alert, cooperative and no distress GI: Soft, not tender today  Incision: healing well  Lab Results:   Recent Labs  10/23/13 0545 10/23/13 1050  WBC 8.9 9.6  HGB 12.2 12.6  HCT 37.4 38.5  PLT 266 296   BMET  Recent Labs  10/23/13 0545  NA 137  K 4.3  CL 104  CO2 26  GLUCOSE 127*  BUN 6  CREATININE 0.72  CALCIUM 8.7    MEDS, Scheduled . FLUoxetine  40 mg Oral Daily  . HYDROmorphone PCA 0.3 mg/mL   Intravenous Q4H  . metFORMIN  500 mg Oral BID WC    Studies/Results: No results found.    LOS: 3 days     Currie Paris, MD, [facility], [place] [phone]   10/25/2013 9:40 AM

## 2013-10-26 ENCOUNTER — Encounter (HOSPITAL_COMMUNITY): Payer: Self-pay | Admitting: General Surgery

## 2013-10-26 NOTE — Progress Notes (Signed)
Quick Note:  Pathology as expected. No evidence of cancer. Calcified cyst. Please let pt know. ______

## 2013-10-28 ENCOUNTER — Telehealth (INDEPENDENT_AMBULATORY_CARE_PROVIDER_SITE_OTHER): Payer: Self-pay | Admitting: General Surgery

## 2013-10-28 ENCOUNTER — Other Ambulatory Visit (INDEPENDENT_AMBULATORY_CARE_PROVIDER_SITE_OTHER): Payer: Self-pay | Admitting: *Deleted

## 2013-10-28 MED ORDER — OXYCODONE-ACETAMINOPHEN 5-325 MG PO TABS: 1.0000 | ORAL_TABLET | ORAL | Status: AC | PRN

## 2013-10-28 NOTE — Telephone Encounter (Signed)
Patient called and stated that she is out of her pain med percocet 5/325. She stated that she was taken them every 1 tab every 2 hours, and now she is wanting a refill. Patient call back number [phone]. Patient just had surgery on 10-22-2013 for a splenic cyst

## 2013-10-28 NOTE — Telephone Encounter (Signed)
Please refill.  Tx. fb #[phone].

## 2013-10-28 NOTE — Telephone Encounter (Signed)
Prescription filled out and taken to urgent office MD to sign prescription for Dr. Donell Beers

## 2013-11-03 NOTE — Discharge Summary (Signed)
Physician Discharge Summary  Patient ID: Beth Thompson MRN: [id number] DOB/AGE: Sep 10, 1976 37 y.o.  Admit date: 10/22/2013 Discharge date: 10/25/2013  Admission Diagnoses: Splenic cyst polycystic ovarian syndrome   Discharge Diagnoses:  Principal Problem:   Splenic cyst   Discharged Condition: stable  Hospital Course:  Pt was admitted to the floor after laparoscopic splenic cystectomy.  She did fine overnight, but had a significant amount of pain on the morning of POD 1.  She got pain relief from IV dilaudid.  She improved over the next day and was able to have her diet advanced on POD 2.  She was able to void spontaneously after foley removal.  She was able to be transitioned to oral narcotics.  She was discharged to home on POD 3 in stable condition.    Consults: None  Significant Diagnostic Studies: labs: CBC prior to d/c HCT 38.5.    Treatments: surgery: lap splenic cystectomy  Discharge Exam: Blood pressure 114/70, pulse 71, temperature 97.7 F (36.5 C), temperature source Oral, resp. rate 13, height 5\' 8"  (1.727 m), weight 245 lb 4.8 oz (111.267 kg), last menstrual period 10/14/2013, SpO2 98.00%. General appearance: alert, cooperative and no distress Resp: breathing comfortably GI: soft, non distended, approp tender Extremities: extremities normal, atraumatic, no cyanosis or edema  Disposition: 01-Home or Self Care  Discharge Orders   Future Appointments Provider Department Dept Phone   11/09/2013 2:00 PM Almond Lint, MD [facility], [place] [phone]   Future Orders Complete By Expires   Diet - low sodium heart healthy  As directed    Discharge instructions  As directed    Comments:     [facility] ______[facility] LAPAROSCOPIC SURGERY: POST OP INSTRUCTIONS Always review your discharge instruction sheet given to you by the facility where your surgery was performed. IF YOU HAVE DISABILITY OR FAMILY LEAVE FORMS, YOU MUST BRING THEM TO  THE OFFICE FOR PROCESSING.   DO NOT GIVE THEM TO YOUR DOCTOR.  A prescription for pain medication may be given to you upon discharge.  Take your pain medication as prescribed, if needed.  If narcotic pain medicine is not needed, then you may take acetaminophen (Tylenol) or ibuprofen (Advil) as needed. Take your usually prescribed medications unless otherwise directed. If you need a refill on your pain medication, please contact your pharmacy.  They will contact our office to request authorization. Prescriptions will not be filled after 5pm or on week-ends. You should follow a light diet the first few days after arrival home, such as soup and crackers, etc.  Be sure to include lots of fluids daily. Most patients will experience some swelling and bruising in the area of the incisions.  Ice packs will help.  Swelling and bruising can take several days to resolve.  It is common to experience some constipation if taking pain medication after surgery.  Increasing fluid intake and taking a stool softener (such as Colace) will usually help or prevent this problem from occurring.  A mild laxative (Milk of Magnesia or Miralax) should be taken according to package instructions if there are no bowel movements after 48 hours. Unless discharge instructions indicate otherwise, you may remove your bandages 24-48 hours after surgery, and you may shower at that time.  You may have steri-strips (small skin tapes) in place directly over the incision.  These strips should be left on the skin for 7-10 days.  If your surgeon used skin glue on the incision, you may shower in 24 hours.  The glue will flake off over the next 2-3 weeks.  Any sutures or staples will be removed at the office during your follow-up visit. ACTIVITIES:  You may resume regular (light) daily activities beginning the next day-such as daily self-care, walking, climbing stairs-gradually increasing activities as tolerated.  You may have sexual intercourse when  it is comfortable.  Refrain from any heavy lifting or straining until approved by your doctor. You may drive when you are no longer taking prescription pain medication, you can comfortably wear a seatbelt, and you can safely maneuver your car and apply brakes. RETURN TO WORK:  __________________________________________________________ Beth Thompson should see your doctor in the office for a follow-up appointment approximately 2-3 weeks after your surgery.  Make sure that you call for this appointment within a day or two after you arrive home to insure a convenient appointment time. OTHER INSTRUCTIONS: __________________________________________________________________________________________________________________________ __________________________________________________________________________________________________________________________ WHEN TO CALL YOUR DOCTOR: Fever over 101.0 Inability to urinate Continued bleeding from incision. Increased pain, redness, or drainage from the incision. Increasing abdominal pain  The clinic staff is available to answer your questions during regular business hours.  Please don't hesitate to call and ask to speak to one of the nurses for clinical concerns.  If you have a medical emergency, go to the nearest emergency room or call 911.  A surgeon from [facility] is always on call at the hospital. [address], [place], [place]  [address] ? [address] [address], [place], [place]   [address] [phone] ? [phone] ? FAX [phone] Web site: [email or web address]   Increase activity slowly  As directed    May shower / Bathe  As directed    Comments:     Starting tomorrow       Medication List         FLUoxetine 40 MG capsule  Commonly known as:  PROZAC  Take 40 mg by mouth daily.     metFORMIN 500 MG tablet  Commonly known as:  GLUCOPHAGE  Take 500 mg by mouth 2 (two) times daily with a meal.     OVER THE COUNTER  MEDICATION  Take 1 tablet by mouth daily. Serra Peptase     PROBIOTIC DAILY PO  Take 1 tablet by mouth 3 (three) times a week.     tiZANidine 4 MG tablet  Commonly known as:  ZANAFLEX  Take 2 mg by mouth every 2 (two) hours as needed (for migraines).     zolpidem 10 MG tablet  Commonly known as:  AMBIEN  Take 5 mg by mouth at bedtime as needed for sleep.           Follow-up Information   Follow up with [facility], MD On 11/09/2013. (2 pm, come 15 min early)    Specialty:  General Surgery   Contact information:   [address] [place] [address] [phone]       Follow up with [facility], MD.   Specialty:  General Surgery   Contact information:   [address] [place] [address] [phone]       Signed: Almond Lint 11/03/2013, 2:00 PM

## 2013-11-09 ENCOUNTER — Ambulatory Visit (INDEPENDENT_AMBULATORY_CARE_PROVIDER_SITE_OTHER): Payer: 59 | Admitting: General Surgery

## 2013-11-09 ENCOUNTER — Encounter (INDEPENDENT_AMBULATORY_CARE_PROVIDER_SITE_OTHER): Payer: Self-pay | Admitting: General Surgery

## 2013-11-09 ENCOUNTER — Telehealth (INDEPENDENT_AMBULATORY_CARE_PROVIDER_SITE_OTHER): Payer: Self-pay

## 2013-11-09 VITALS — BP 136/90 | HR 64 | Temp 98.4°F | Resp 14 | Ht 68.0 in | Wt 242.6 lb

## 2013-11-09 DIAGNOSIS — D734 Cyst of spleen: Secondary | ICD-10-CM

## 2013-11-09 DIAGNOSIS — D7389 Other diseases of spleen: Secondary | ICD-10-CM

## 2013-11-09 NOTE — Telephone Encounter (Signed)
Pt made aware of benign pathology - calcified cyst.

## 2013-11-09 NOTE — Patient Instructions (Addendum)
  Take ibuprofen as needed.  Follow up as needed.

## 2013-11-10 NOTE — Assessment & Plan Note (Addendum)
Improving discomfort.    Advised to use heat on upper abdomen and to take ibuprofen as needed.  Follow up as needed.

## 2013-11-10 NOTE — Progress Notes (Signed)
HISTORY: Pt doing well around 2-3 weeks s/p laparoscopic splenic cystectomy.  The LUQ pain she was having pre operatively is gone.  She does have some soreness in her supraumbilical abdominal wall.  She feels a pulling sensation.  She denies nausea or vomiting.  Her appetite and energy continue to improve.      EXAM: General:  Alert and oriented.   Incision:  Healing well.     PATHOLOGY: Diagnosis Cyst, excision, Splenic - BENIGN SPLEEN WITH DENSELY HYALINIZING CALCIFIED PARENCHYMAL NODULE, SEE COMMENT.   ASSESSMENT AND PLAN:   Splenic cyst Improving discomfort.    Advised to use heat on upper abdomen and to take ibuprofen as needed.  Follow up as needed.          Maudry Diego, MD Surgical Oncology, General & Endocrine Surgery [facility]  Joycelyn Rua, MD Joycelyn Rua, MD

## 2016-01-04 ENCOUNTER — Other Ambulatory Visit: Payer: Self-pay | Admitting: Family Medicine

## 2016-01-04 DIAGNOSIS — Z1231 Encounter for screening mammogram for malignant neoplasm of breast: Secondary | ICD-10-CM

## 2016-10-26 ENCOUNTER — Other Ambulatory Visit: Payer: Self-pay | Admitting: Family Medicine

## 2016-10-26 DIAGNOSIS — Z1231 Encounter for screening mammogram for malignant neoplasm of breast: Secondary | ICD-10-CM

## 2016-11-12 ENCOUNTER — Ambulatory Visit (HOSPITAL_BASED_OUTPATIENT_CLINIC_OR_DEPARTMENT_OTHER): Payer: 59

## 2018-02-25 ENCOUNTER — Other Ambulatory Visit: Payer: Self-pay | Admitting: Family Medicine

## 2018-02-25 DIAGNOSIS — R1013 Epigastric pain: Secondary | ICD-10-CM

## 2018-02-25 DIAGNOSIS — R112 Nausea with vomiting, unspecified: Secondary | ICD-10-CM

## 2018-10-24 ENCOUNTER — Other Ambulatory Visit (HOSPITAL_BASED_OUTPATIENT_CLINIC_OR_DEPARTMENT_OTHER): Payer: Self-pay | Admitting: Family Medicine

## 2018-10-24 DIAGNOSIS — Z1231 Encounter for screening mammogram for malignant neoplasm of breast: Secondary | ICD-10-CM

## 2018-10-29 ENCOUNTER — Encounter (HOSPITAL_BASED_OUTPATIENT_CLINIC_OR_DEPARTMENT_OTHER): Payer: Self-pay

## 2018-10-29 ENCOUNTER — Ambulatory Visit (HOSPITAL_BASED_OUTPATIENT_CLINIC_OR_DEPARTMENT_OTHER)
Admission: RE | Admit: 2018-10-29 | Discharge: 2018-10-29 | Disposition: A | Discharge status: Home / Self Care | Payer: 59 | Source: Ambulatory Visit | Attending: Family Medicine | Admitting: Family Medicine

## 2018-10-29 DIAGNOSIS — Z1231 Encounter for screening mammogram for malignant neoplasm of breast: Secondary | ICD-10-CM | POA: Diagnosis not present

## 2018-10-29 HISTORY — DX: Diffuse cystic mastopathy of unspecified breast: N60.19

## 2018-12-05 ENCOUNTER — Other Ambulatory Visit: Payer: Self-pay | Admitting: Nurse Practitioner

## 2018-12-05 ENCOUNTER — Other Ambulatory Visit (HOSPITAL_COMMUNITY)
Admission: RE | Admit: 2018-12-05 | Discharge: 2018-12-05 | Disposition: A | Discharge status: Home / Self Care | Payer: 59 | Source: Ambulatory Visit | Attending: Nurse Practitioner | Admitting: Nurse Practitioner

## 2018-12-05 DIAGNOSIS — Z01419 Encounter for gynecological examination (general) (routine) without abnormal findings: Secondary | ICD-10-CM | POA: Diagnosis present

## 2018-12-09 LAB — CYTOLOGY - PAP
Diagnosis: NEGATIVE
HPV: NOT DETECTED

## 2018-12-23 ENCOUNTER — Ambulatory Visit: Payer: 59 | Admitting: Registered"

## 2019-01-02 ENCOUNTER — Ambulatory Visit: Payer: 59 | Admitting: Registered"

## 2022-01-01 ENCOUNTER — Emergency Department (HOSPITAL_BASED_OUTPATIENT_CLINIC_OR_DEPARTMENT_OTHER): Payer: BC Managed Care – PPO

## 2022-01-01 ENCOUNTER — Encounter (HOSPITAL_BASED_OUTPATIENT_CLINIC_OR_DEPARTMENT_OTHER): Payer: Self-pay | Admitting: *Deleted

## 2022-01-01 ENCOUNTER — Other Ambulatory Visit: Payer: Self-pay

## 2022-01-01 ENCOUNTER — Emergency Department (HOSPITAL_BASED_OUTPATIENT_CLINIC_OR_DEPARTMENT_OTHER)
Admission: EM | Admit: 2022-01-01 | Discharge: 2022-01-01 | Disposition: A | Discharge status: Home / Self Care | Payer: BC Managed Care – PPO | Attending: Emergency Medicine | Admitting: Emergency Medicine

## 2022-01-01 DIAGNOSIS — E119 Type 2 diabetes mellitus without complications: Secondary | ICD-10-CM | POA: Insufficient documentation

## 2022-01-01 DIAGNOSIS — R1084 Generalized abdominal pain: Secondary | ICD-10-CM | POA: Diagnosis present

## 2022-01-01 DIAGNOSIS — Z79899 Other long term (current) drug therapy: Secondary | ICD-10-CM | POA: Diagnosis not present

## 2022-01-01 DIAGNOSIS — Z7984 Long term (current) use of oral hypoglycemic drugs: Secondary | ICD-10-CM | POA: Diagnosis not present

## 2022-01-01 DIAGNOSIS — K5900 Constipation, unspecified: Secondary | ICD-10-CM | POA: Insufficient documentation

## 2022-01-01 HISTORY — DX: Acute pancreatitis without necrosis or infection, unspecified: K85.90

## 2022-01-01 HISTORY — DX: Gastroparesis: K31.84

## 2022-01-01 LAB — URINALYSIS, ROUTINE W REFLEX MICROSCOPIC
Bilirubin Urine: NEGATIVE
Glucose, UA: >=500 mg/dL — AB
Ketones, ur: 80 mg/dL — AB
Leukocytes,Ua: NEGATIVE
Nitrite: NEGATIVE
Protein, ur: NEGATIVE mg/dL
Specific Gravity, Urine: 1.02 (ref 1.005–1.030)
pH: 6 (ref 5.0–8.0)

## 2022-01-01 LAB — COMPREHENSIVE METABOLIC PANEL
ALT: 41 U/L (ref 0–44)
AST: 34 U/L (ref 15–41)
Albumin: 3.7 g/dL (ref 3.5–5.0)
Alkaline Phosphatase: 63 U/L (ref 38–126)
Anion gap: 13 (ref 5–15)
BUN: 19 mg/dL (ref 6–20)
CO2: 24 mmol/L (ref 22–32)
Calcium: 10 mg/dL (ref 8.9–10.3)
Chloride: 98 mmol/L (ref 98–111)
Creatinine, Ser: 0.66 mg/dL (ref 0.44–1.00)
GFR, Estimated: >60 mL/min (ref >60)
Glucose, Bld: 152 mg/dL — ABNORMAL HIGH (ref 70–99)
Potassium: 3.8 mmol/L (ref 3.5–5.1)
Sodium: 135 mmol/L (ref 135–145)
Total Bilirubin: 0.7 mg/dL (ref 0.3–1.2)
Total Protein: 7.9 g/dL (ref 6.5–8.1)

## 2022-01-01 LAB — CBC WITH DIFFERENTIAL/PLATELET
Abs Immature Granulocytes: 0.07 10*3/uL (ref 0.00–0.07)
Basophils Absolute: 0.1 10*3/uL (ref 0.0–0.1)
Basophils Relative: 1 %
Eosinophils Absolute: 0.2 10*3/uL (ref 0.0–0.5)
Eosinophils Relative: 3 %
HCT: 44.4 % (ref 36.0–46.0)
Hemoglobin: 14.5 g/dL (ref 12.0–15.0)
Immature Granulocytes: 1 %
Lymphocytes Relative: 26 %
Lymphs Abs: 2.2 10*3/uL (ref 0.7–4.0)
MCH: 26.1 pg (ref 26.0–34.0)
MCHC: 32.7 g/dL (ref 30.0–36.0)
MCV: 79.9 fL — ABNORMAL LOW (ref 80.0–100.0)
Monocytes Absolute: 0.5 10*3/uL (ref 0.1–1.0)
Monocytes Relative: 6 %
Neutro Abs: 5.6 10*3/uL (ref 1.7–7.7)
Neutrophils Relative %: 63 %
Platelets: 404 10*3/uL — ABNORMAL HIGH (ref 150–400)
RBC: 5.56 MIL/uL — ABNORMAL HIGH (ref 3.87–5.11)
RDW: 16.8 % — ABNORMAL HIGH (ref 11.5–15.5)
WBC: 8.7 10*3/uL (ref 4.0–10.5)
nRBC: 0 % (ref 0.0–0.2)

## 2022-01-01 LAB — LIPASE, BLOOD: Lipase: 51 U/L (ref 11–51)

## 2022-01-01 LAB — PREGNANCY, URINE: Preg Test, Ur: NEGATIVE

## 2022-01-01 LAB — URINALYSIS, MICROSCOPIC (REFLEX): RBC / HPF: >50 RBC/hpf (ref 0–5)

## 2022-01-01 MED ORDER — IOHEXOL 300 MG/ML  SOLN
100.0000 mL | Freq: Once | INTRAMUSCULAR | Status: AC | PRN
  Administered 2022-01-01: 100 mL via INTRAVENOUS

## 2022-01-01 MED ORDER — DIPHENHYDRAMINE HCL 25 MG PO CAPS
25.0000 mg | ORAL_CAPSULE | Freq: Once | ORAL | Status: AC
  Administered 2022-01-01: 25 mg via ORAL
  Filled 2022-01-01: qty 1

## 2022-01-01 MED ORDER — SODIUM CHLORIDE 0.9 % IV BOLUS
1000.0000 mL | Freq: Once | INTRAVENOUS | Status: AC
  Administered 2022-01-01: 1000 mL via INTRAVENOUS

## 2022-01-01 MED ORDER — PROCHLORPERAZINE EDISYLATE 10 MG/2ML IJ SOLN
10.0000 mg | Freq: Once | INTRAMUSCULAR | Status: AC
Start: 2022-01-01 — End: 2022-01-01
  Administered 2022-01-01: 10 mg via INTRAVENOUS
  Filled 2022-01-01: qty 2

## 2022-01-01 MED ORDER — OXYCODONE-ACETAMINOPHEN 5-325 MG PO TABS
1.0000 | ORAL_TABLET | Freq: Once | ORAL | Status: AC
  Administered 2022-01-01: 1 via ORAL
  Filled 2022-01-01: qty 1

## 2022-01-01 MED ORDER — SENNOSIDES-DOCUSATE SODIUM 8.6-50 MG PO TABS
1.0000 | ORAL_TABLET | Freq: Every day | ORAL | 0 refills | Status: AC
Start: 2022-01-01 — End: 2022-01-31

## 2022-01-01 NOTE — ED Provider Notes (Signed)
[facility] Provider Note   CSN: [id number] Arrival date & time: 01/01/22  1541     History  Chief Complaint  Patient presents with   Constipation   Abdominal Pain    Beth Thompson is a 46 y.o. female.  The history is provided by the patient.  Abdominal Pain Pain location:  Generalized Pain quality: aching and bloating   Pain radiates to:  Does not radiate Pain severity:  Mild Onset quality:  Gradual Duration:  1 week Timing:  Constant Progression:  Worsening Chronicity:  New Context comment:  Hx of diabetes on ozmpic, chronic pain on opioids Relieved by:  Nothing Worsened by:  Nothing Associated symptoms: constipation and nausea   Associated symptoms: no anorexia, no belching, no chest pain, no chills, no cough, no diarrhea, no dysuria, no fatigue, no fever, no flatus, no hematemesis, no hematochezia, no hematuria, no melena, no shortness of breath, no sore throat and no vomiting   Risk factors: has not had multiple surgeries       Home Medications Prior to Admission medications   Medication Sig Start Date End Date Taking? Authorizing Provider  senna-docusate (SENOKOT-S) 8.6-50 MG tablet Take 1 tablet by mouth daily. 01/01/22 01/31/22 Yes Artur Winningham, DO  FLUoxetine (PROZAC) 40 MG capsule Take 40 mg by mouth daily.    [provider]  metFORMIN (GLUCOPHAGE) 500 MG tablet Take 500 mg by mouth 2 (two) times daily with a meal.    [provider]  OVER THE COUNTER MEDICATION Take 1 tablet by mouth daily. [facility]    [provider]  oxyCODONE-acetaminophen (PERCOCET/ROXICET) 5-325 MG per tablet Take 1-2 tablets by mouth every 4 (four) hours as needed for moderate pain (Every 4-6 hours PRN). 10/28/13   Stark Klein, MD  Probiotic Product (PROBIOTIC DAILY PO) Take 1 tablet by mouth 3 (three) times a week.    [provider]  tiZANidine (ZANAFLEX) 4 MG tablet Take 2 mg by mouth every 2 (two) hours  as needed (for migraines).    [provider]  zolpidem (AMBIEN) 10 MG tablet Take 5 mg by mouth at bedtime as needed for sleep.     [provider]      Allergies    Topamax [topiramate], Ceftin [cefuroxime axetil], Gluten meal, and Sumatriptan    Review of Systems   Review of Systems  Constitutional:  Negative for chills, fatigue and fever.  HENT:  Negative for sore throat.   Respiratory:  Negative for cough and shortness of breath.   Cardiovascular:  Negative for chest pain.  Gastrointestinal:  Positive for constipation and nausea. Negative for anorexia, diarrhea, flatus, hematemesis, hematochezia, melena and vomiting.  Genitourinary:  Negative for dysuria and hematuria.   Physical Exam Updated Vital Signs BP (!) 177/108    Pulse 80    Temp 98 F (36.7 C) (Oral)    Resp 18    Ht 5\' 8"  (1.727 m)    Wt 95.7 kg    LMP 12/27/2021    SpO2 97%    BMI 32.08 kg/m  Physical Exam Vitals and nursing note reviewed.  Constitutional:      General: She is not in acute distress.    Appearance: She is well-developed.  HENT:     Head: Normocephalic and atraumatic.  Eyes:     Extraocular Movements: Extraocular movements intact.     Conjunctiva/sclera: Conjunctivae normal.     Pupils: Pupils are equal, round, and reactive to light.  Cardiovascular:     Rate and Rhythm: Normal rate and regular rhythm.     Heart sounds: Normal heart sounds. No murmur heard. Pulmonary:     Effort: Pulmonary effort is normal. No respiratory distress.     Breath sounds: Normal breath sounds.  Abdominal:     General: There is distension.     Palpations: Abdomen is soft.     Tenderness: There is generalized abdominal tenderness.  Musculoskeletal:        General: No swelling.     Cervical back: Neck supple.  Skin:    General: Skin is warm and dry.     Capillary Refill: Capillary refill takes less than 2 seconds.  Neurological:     Mental Status: She is alert.  Psychiatric:        Mood and  Affect: Mood normal.    ED Results / Procedures / Treatments   Labs (all labs ordered are listed, but only abnormal results are displayed) Labs Reviewed  CBC WITH DIFFERENTIAL/PLATELET - Abnormal; Notable for the following components:      Result Value   RBC 5.56 (*)    MCV 79.9 (*)    RDW 16.8 (*)    Platelets 404 (*)    All other components within normal limits  COMPREHENSIVE METABOLIC PANEL - Abnormal; Notable for the following components:   Glucose, Bld 152 (*)    All other components within normal limits  URINALYSIS, ROUTINE W REFLEX MICROSCOPIC - Abnormal; Notable for the following components:   Color, Urine AMBER (*)    APPearance CLOUDY (*)    Glucose, UA >=500 (*)    Hgb urine dipstick LARGE (*)    Ketones, ur 80 (*)    All other components within normal limits  URINALYSIS, MICROSCOPIC (REFLEX) - Abnormal; Notable for the following components:   Bacteria, UA FEW (*)    All other components within normal limits  LIPASE, BLOOD  PREGNANCY, URINE    EKG None  Radiology CT ABDOMEN PELVIS W CONTRAST  Result Date: 01/01/2022 CLINICAL DATA:  Bowel obstruction suspected. No bowel movement for 1 week. Constipation. EXAM: CT ABDOMEN AND PELVIS WITH CONTRAST TECHNIQUE: Multidetector CT imaging of the abdomen and pelvis was performed using the standard protocol following bolus administration of intravenous contrast. RADIATION DOSE REDUCTION: This exam was performed according to the departmental dose-optimization program which includes automated exposure control, adjustment of the mA and/or kV according to patient size and/or use of iterative reconstruction technique. CONTRAST:  155mL OMNIPAQUE IOHEXOL 300 MG/ML  SOLN COMPARISON:  CT 11/05/2011 FINDINGS: Lower chest: No acute airspace disease or pleural effusion. Hepatobiliary: Advanced hepatic steatosis. The liver is enlarged spanning 26 cm craniocaudal. No focal hepatic abnormality. No capsular nodularity. Clips in the gallbladder  fossa postcholecystectomy. No biliary dilatation. Pancreas: No ductal dilatation or inflammation. Spleen: Previous splenic cyst is no longer seen. The spleen is normal in size. No focal abnormalities. Adrenals/Urinary Tract: Normal adrenal glands. No hydronephrosis or perinephric edema. Homogeneous renal enhancement with symmetric excretion on delayed phase imaging. No evidence of focal renal lesion or stone. Urinary bladder is physiologically distended without wall thickening. Stomach/Bowel: Small hiatal hernia. The stomach is otherwise unremarkable. No abnormal gastric distension. Occasional fluid-filled small bowel without obstruction or small bowel inflammation. Normal appendix. Moderate volume colonic stool colonic tortuosity. Scattered surgical clips adjacent to the sigmoid colon. There is no colonic wall thickening, pericolonic edema, or evidence of colonic mass pain no significant diverticular disease. Vascular/Lymphatic: Normal caliber abdominal aorta. Patent portal  vein. No acute vascular findings. No enlarged lymph nodes in the abdomen or pelvis. Reproductive: There is a 4.1 cm simple cyst in the left ovary/adnexa and no complexity by CT. Probable nabothian cyst in the cervix. The uterus is otherwise unremarkable. No suspicious adnexal mass. Other: No free air, free fluid, or intra-abdominal fluid collection. Musculoskeletal: Grade 1 anterolisthesis of L5 on S1 likely due to facet hypertrophy. There is also degenerative disc disease at this level. Degenerative changes at the pubic symphysis. There are no acute or suspicious osseous abnormalities. IMPRESSION: 1. No bowel obstruction or acute abnormality in the abdomen/pelvis. Moderate colonic stool burden with colonic tortuosity, suggesting constipation. 2. Simple left ovarian cyst measuring 4.1 cm. No follow-up imaging recommended. Note: This recommendation does not apply to premenarchal patients and to those with increased risk (genetic, family history,  elevated tumor markers or other high-risk factors) of ovarian cancer. Reference: JACR 2020 Feb; 17(2):248-254 3. Hepatomegaly and advanced hepatic steatosis. 4. Small hiatal hernia. 5. Grade 1 anterolisthesis of L5 on S1 likely due to facet hypertrophy. There is also degenerative disc disease at this level. Electronically Signed   By: Keith Rake M.D.   On: 01/01/2022 18:56    Procedures Procedures    Medications Ordered in ED Medications  sodium chloride 0.9 % bolus 1,000 mL (0 mLs Intravenous Stopped 01/01/22 1756)  oxyCODONE-acetaminophen (PERCOCET/ROXICET) 5-325 MG per tablet 1 tablet (1 tablet Oral Given 01/01/22 1702)  prochlorperazine (COMPAZINE) injection 10 mg (10 mg Intravenous Given 01/01/22 1806)  diphenhydrAMINE (BENADRYL) capsule 25 mg (25 mg Oral Given 01/01/22 1809)  iohexol (OMNIPAQUE) 300 MG/ML solution 100 mL (100 mLs Intravenous Contrast Given 01/01/22 1826)    ED Course/ Medical Decision Making/ A&P                           Medical Decision Making Amount and/or Complexity of Data Reviewed Labs: ordered. Radiology: ordered.  Risk Prescription drug management.   KIESA SAKAL is a 46 year old female who presents to the emergency department with abdominal pain.  Comorbidities pertinent with this chief complaint are history of gastroparesis, pancreatitis, chronic pain on chronic opioids, cholecystectomy.  Patient states that she has had ongoing pain for about a week with constipation.  She has been using multiple over-the-counter medications with no relief of her constipation.  She is having nausea.  She feels like she is not passing gas.  She feels like food is not making its way through.  Having pain more in her upper abdomen.  Denies feeling rectal impaction.  Belly is distended and tender diffusely on exam.  Exams otherwise unremarkable.  Differential diagnosis includes pancreatitis versus bowel obstruction versus constipation versus medication side effect as  patient is on chronic opioids and Ozempic.  To evaluate we will get CBC, CMP, lipase, CT scan abdomen and pelvis.  Will give IV fluid bolus.  Lab work has been reviewed and interpreted by myself.  No evidence of urine infection.  Pregnancy test is negative.  No significant anemia, electrolyte ab Mody, kidney injury.  CT scan abdomen pelvis per radiology note shows no bowel obstruction.  Overall moderate stool burden suggesting constipation.  She has a simple left ovarian cyst.  Otherwise unremarkable CT.  Recommend MiraLAX and will prescribe docusate.  Discharged in good condition.  Overall patient with constipation likely secondary to chronic opioid use.  Bowel regimen has been discussed.  This chart was dictated using voice recognition software.  Despite best efforts  to proofread,  errors can occur which can change the documentation meaning.         Final Clinical Impression(s) / ED Diagnoses Final diagnoses:  Constipation, unspecified constipation type    Rx / DC Orders ED Discharge Orders          Ordered    senna-docusate (SENOKOT-S) 8.6-50 MG tablet  Daily        01/01/22 1921              Lennice Sites, DO 01/01/22 1921

## 2022-01-01 NOTE — ED Triage Notes (Signed)
Constipation. No BM in a week. No relief with otc laxatives. She goes to a pain clinic and takes Opiates.

## 2022-01-01 NOTE — Discharge Instructions (Signed)
Continue to use MiraLAX as discussed.  Recommend 4-6 capfuls in 1 L of water and titrate till desired effect.

## 2022-01-01 NOTE — ED Notes (Signed)
Patient transported to CT
